# Patient Record
Sex: Female | Born: 1945 | Race: White | Hispanic: No | Marital: Married | Smoking: Former smoker
Health system: Southern US, Community
[De-identification: ages and names within clinical notes are randomized; demographics above are authoritative.]

## PROBLEM LIST (undated history)

## (undated) DIAGNOSIS — N390 Urinary tract infection, site not specified: Secondary | ICD-10-CM

## (undated) DIAGNOSIS — IMO0001 Reserved for inherently not codable concepts without codable children: Secondary | ICD-10-CM

## (undated) DIAGNOSIS — Q6689 Other  specified congenital deformities of feet: Secondary | ICD-10-CM

## (undated) DIAGNOSIS — F329 Major depressive disorder, single episode, unspecified: Secondary | ICD-10-CM

## (undated) DIAGNOSIS — S42301A Unspecified fracture of shaft of humerus, right arm, initial encounter for closed fracture: Secondary | ICD-10-CM

## (undated) DIAGNOSIS — K269 Duodenal ulcer, unspecified as acute or chronic, without hemorrhage or perforation: Secondary | ICD-10-CM

## (undated) DIAGNOSIS — F32A Depression, unspecified: Secondary | ICD-10-CM

## (undated) DIAGNOSIS — K219 Gastro-esophageal reflux disease without esophagitis: Secondary | ICD-10-CM

## (undated) DIAGNOSIS — F419 Anxiety disorder, unspecified: Secondary | ICD-10-CM

## (undated) DIAGNOSIS — D649 Anemia, unspecified: Secondary | ICD-10-CM

## (undated) DIAGNOSIS — R51 Headache: Secondary | ICD-10-CM

## (undated) DIAGNOSIS — Z87442 Personal history of urinary calculi: Secondary | ICD-10-CM

## (undated) DIAGNOSIS — Z8719 Personal history of other diseases of the digestive system: Secondary | ICD-10-CM

## (undated) DIAGNOSIS — M1990 Unspecified osteoarthritis, unspecified site: Secondary | ICD-10-CM

## (undated) DIAGNOSIS — R519 Headache, unspecified: Secondary | ICD-10-CM

## (undated) DIAGNOSIS — Z8489 Family history of other specified conditions: Secondary | ICD-10-CM

## (undated) HISTORY — PX: JOINT REPLACEMENT: SHX530

## (undated) HISTORY — PX: ABDOMINAL HYSTERECTOMY: SHX81

---

## 1978-06-05 HISTORY — PX: ABDOMINAL HYSTERECTOMY: SHX81

## 2014-01-27 DIAGNOSIS — M25551 Pain in right hip: Secondary | ICD-10-CM | POA: Insufficient documentation

## 2014-01-27 DIAGNOSIS — M5416 Radiculopathy, lumbar region: Secondary | ICD-10-CM | POA: Insufficient documentation

## 2014-04-17 NOTE — Progress Notes (Signed)
Please put orders in Epic surgery 05-04-14 pre op 04-27-14 Thanks

## 2014-04-21 ENCOUNTER — Ambulatory Visit: Payer: Self-pay | Admitting: Orthopedic Surgery

## 2014-04-27 ENCOUNTER — Encounter (HOSPITAL_COMMUNITY)
Admission: RE | Admit: 2014-04-27 | Discharge: 2014-04-27 | Disposition: A | Discharge status: Home / Self Care | Payer: Medicare Other | Source: Ambulatory Visit | Attending: Orthopedic Surgery | Admitting: Orthopedic Surgery

## 2014-04-27 ENCOUNTER — Ambulatory Visit (HOSPITAL_COMMUNITY)
Admission: RE | Admit: 2014-04-27 | Discharge: 2014-04-27 | Disposition: A | Discharge status: Home / Self Care | Payer: Medicare Other | Source: Ambulatory Visit | Attending: Orthopedic Surgery | Admitting: Orthopedic Surgery

## 2014-04-27 ENCOUNTER — Encounter (HOSPITAL_COMMUNITY): Payer: Self-pay

## 2014-04-27 DIAGNOSIS — Z01818 Encounter for other preprocedural examination: Secondary | ICD-10-CM | POA: Diagnosis present

## 2014-04-27 DIAGNOSIS — M1611 Unilateral primary osteoarthritis, right hip: Secondary | ICD-10-CM | POA: Diagnosis not present

## 2014-04-27 HISTORY — DX: Reserved for inherently not codable concepts without codable children: IMO0001

## 2014-04-27 HISTORY — DX: Unspecified osteoarthritis, unspecified site: M19.90

## 2014-04-27 HISTORY — DX: Anxiety disorder, unspecified: F41.9

## 2014-04-27 HISTORY — DX: Major depressive disorder, single episode, unspecified: F32.9

## 2014-04-27 HISTORY — DX: Family history of other specified conditions: Z84.89

## 2014-04-27 HISTORY — DX: Headache, unspecified: R51.9

## 2014-04-27 HISTORY — DX: Depression, unspecified: F32.A

## 2014-04-27 HISTORY — DX: Headache: R51

## 2014-04-27 HISTORY — DX: Other specified congenital deformities of feet: Q66.89

## 2014-04-27 HISTORY — DX: Unspecified fracture of shaft of humerus, right arm, initial encounter for closed fracture: S42.301A

## 2014-04-27 LAB — COMPREHENSIVE METABOLIC PANEL
ALBUMIN: 3.9 g/dL (ref 3.5–5.2)
ALK PHOS: 67 U/L (ref 39–117)
ALT: 19 U/L (ref 0–35)
ANION GAP: 11 (ref 5–15)
AST: 21 U/L (ref 0–37)
BUN: 17 mg/dL (ref 6–23)
CO2: 27 mEq/L (ref 19–32)
CREATININE: 0.74 mg/dL (ref 0.50–1.10)
Calcium: 9.7 mg/dL (ref 8.4–10.5)
Chloride: 101 mEq/L (ref 96–112)
GFR calc non Af Amer: 86 mL/min — ABNORMAL LOW (ref >90)
Glucose, Bld: 92 mg/dL (ref 70–99)
POTASSIUM: 4.9 meq/L (ref 3.7–5.3)
Sodium: 139 mEq/L (ref 137–147)
TOTAL PROTEIN: 7.2 g/dL (ref 6.0–8.3)
Total Bilirubin: 0.4 mg/dL (ref 0.3–1.2)

## 2014-04-27 LAB — CBC
HCT: 41.9 % (ref 36.0–46.0)
HEMOGLOBIN: 13.7 g/dL (ref 12.0–15.0)
MCH: 34 pg (ref 26.0–34.0)
MCHC: 32.7 g/dL (ref 30.0–36.0)
MCV: 104 fL — AB (ref 78.0–100.0)
Platelets: 274 10*3/uL (ref 150–400)
RBC: 4.03 MIL/uL (ref 3.87–5.11)
RDW: 13 % (ref 11.5–15.5)
WBC: 4.7 10*3/uL (ref 4.0–10.5)

## 2014-04-27 LAB — PROTIME-INR
INR: 1 (ref 0.00–1.49)
PROTHROMBIN TIME: 13.3 s (ref 11.6–15.2)

## 2014-04-27 LAB — URINALYSIS, ROUTINE W REFLEX MICROSCOPIC
Bilirubin Urine: NEGATIVE
Glucose, UA: NEGATIVE mg/dL
HGB URINE DIPSTICK: NEGATIVE
Ketones, ur: NEGATIVE mg/dL
LEUKOCYTES UA: NEGATIVE
Nitrite: NEGATIVE
PROTEIN: NEGATIVE mg/dL
Specific Gravity, Urine: 1.021 (ref 1.005–1.030)
UROBILINOGEN UA: 0.2 mg/dL (ref 0.0–1.0)
pH: 5.5 (ref 5.0–8.0)

## 2014-04-27 LAB — ABO/RH: ABO/RH(D): A POS

## 2014-04-27 LAB — SURGICAL PCR SCREEN
MRSA, PCR: NEGATIVE
STAPHYLOCOCCUS AUREUS: NEGATIVE

## 2014-04-27 LAB — APTT: APTT: 31 s (ref 24–37)

## 2014-04-27 NOTE — Progress Notes (Signed)
Clearance note per chart 03/16/2014 per Dr Hall Busing EKG per chart 03/16/2014

## 2014-04-27 NOTE — Patient Instructions (Signed)
YOUR SURGERY IS SCHEDULED AT [facility]  WF:[id number], May 04, 2014  REPORT TO  SHORT STAY CENTER AT:10:50 AM    PLEASE COME IN THE [facility] ENTRANCE AND FOLLOW SIGNS TO SHORT STAY CENTER.  DO NOT EAT  ANYTHING AFTER MIDNIGHT BUT YOU MAY TAKE CLEARS LIQUIDS UNTIL 7:30AM DAY OF SURGERY THEN NOTHING BY MOUTH.  PLEASE TAKE THE FOLLOWING MEDICATIONS THE AM OF YOUR SURGERY WITH A FEW SIPS OF WATER:Escitalopram;Hydrocodone-Acetaminophen if needed for pain     DO NOT BRING VALUABLES, MONEY, CREDIT CARDS.  DO NOT WEAR JEWELRY, MAKE-UP, NAIL POLISH AND NO METAL PINS OR CLIPS IN YOUR HAIR. CONTACT LENS, DENTURES / PARTIALS, GLASSES SHOULD NOT BE WORN TO SURGERY AND IN MOST CASES-HEARING AIDS WILL NEED TO BE REMOVED.  BRING YOUR GLASSES CASE, ANY EQUIPMENT NEEDED FOR YOUR CONTACT LENS. FOR PATIENTS ADMITTED TO THE HOSPITAL--CHECK OUT TIME THE DAY OF DISCHARGE IS 11:00 AM.  ALL INPATIENT ROOMS ARE PRIVATE - WITH BATHROOM, TELEPHONE, TELEVISION AND WIFI INTERNET.                                                    PLEASE READ OVER ANY  FACT SHEETS THAT YOU WERE GIVEN: MRSA INFORMATION, BLOOD TRANSFUSION INFORMATION, INCENTIVE SPIROMETER INFORMATION.  PLEASE BE AWARE THAT YOU MAY NEED ADDITIONAL BLOOD DRAWN DAY OF YOUR SURGERY  _______________________________________________________________________   [facility] - Preparing for Surgery Before surgery, you can play an important role.  Because skin is not sterile, your skin needs to be as free of germs as possible.  You can reduce the number of germs on your skin by washing with CHG (chlorahexidine gluconate) soap before surgery.  CHG is an antiseptic cleaner which kills germs and bonds with the skin to continue killing germs even after washing. Please DO NOT use if you have an allergy to CHG or antibacterial soaps.  If your skin becomes reddened/irritated stop using the CHG and inform your nurse when you arrive at Short  Stay. Do not shave (including legs and underarms) for at least 48 hours prior to the first CHG shower.  You may shave your face/neck. Please follow these instructions carefully:  1.  Shower with CHG Soap the night before surgery and the  morning of Surgery.  2.  If you choose to wash your hair, wash your hair first as usual with your  normal  shampoo.  3.  After you shampoo, rinse your hair and body thoroughly to remove the  shampoo.                           4.  Use CHG as you would any other liquid soap.  You can apply chg directly  to the skin and wash                       Gently with a scrungie or clean washcloth.  5.  Apply the CHG Soap to your body ONLY FROM THE NECK DOWN.   Do not use on face/ open                           Wound or open sores. Avoid contact with eyes, ears mouth and genitals (private parts).  Wash face,  Genitals (private parts) with your normal soap.             6.  Wash thoroughly, paying special attention to the area where your surgery  will be performed.  7.  Thoroughly rinse your body with warm water from the neck down.  8.  DO NOT shower/wash with your normal soap after using and rinsing off  the CHG Soap.                9.  Pat yourself dry with a clean towel.            10.  Wear clean pajamas.            11.  Place clean sheets on your bed the night of your first shower and do not  sleep with pets. Day of Surgery : Do not apply any lotions/deodorants the morning of surgery.  Please wear clean clothes to the hospital/surgery center.  FAILURE TO FOLLOW THESE INSTRUCTIONS MAY RESULT IN THE CANCELLATION OF YOUR SURGERY PATIENT SIGNATURE_________________________________  NURSE SIGNATURE__________________________________  ________________________________________________________________________    CLEAR LIQUID DIET   Foods Allowed                                                                     Foods Excluded  Coffee and tea,  regular and decaf                             liquids that you cannot  Plain Jell-O in any flavor                                             see through such as: Fruit ices (not with fruit pulp)                                     milk, soups, orange juice  Iced Popsicles                                    All solid food Carbonated beverages, regular and diet                                    Cranberry, grape and apple juices Sports drinks like Gatorade Lightly seasoned clear broth or consume(fat free) Sugar, honey syrup  Sample Menu Breakfast                                Lunch                                     Supper Cranberry juice  Beef broth                            Chicken broth Jell-O                                     Grape juice                           Apple juice Coffee or tea                        Jell-O                                      Popsicle                                                Coffee or tea                        Coffee or tea  _____________________________________________________________________    Incentive Spirometer  An incentive spirometer is a tool that can help keep your lungs clear and active. This tool measures how well you are filling your lungs with each breath. Taking long deep breaths may help reverse or decrease the chance of developing breathing (pulmonary) problems (especially infection) following:  A long period of time when you are unable to move or be active. BEFORE THE PROCEDURE   If the spirometer includes an indicator to show your best effort, your nurse or respiratory therapist will set it to a desired goal.  If possible, sit up straight or lean slightly forward. Try not to slouch.  Hold the incentive spirometer in an upright position. INSTRUCTIONS FOR USE   Sit on the edge of your bed if possible, or sit up as far as you can in bed or on a chair.  Hold the incentive spirometer in an upright  position.  Breathe out normally.  Place the mouthpiece in your mouth and seal your lips tightly around it.  Breathe in slowly and as deeply as possible, raising the piston or the ball toward the top of the column.  Hold your breath for 3-5 seconds or for as long as possible. Allow the piston or ball to fall to the bottom of the column.  Remove the mouthpiece from your mouth and breathe out normally.  Rest for a few seconds and repeat Steps 1 through 7 at least 10 times every 1-2 hours when you are awake. Take your time and take a few normal breaths between deep breaths.  The spirometer may include an indicator to show your best effort. Use the indicator as a goal to work toward during each repetition.  After each set of 10 deep breaths, practice coughing to be sure your lungs are clear. If you have an incision (the cut made at the time of surgery), support your incision when coughing by placing a pillow or rolled up towels firmly against it. Once you are able to get out of bed, walk around indoors and cough well. You may stop using the incentive  spirometer when instructed by your caregiver.  RISKS AND COMPLICATIONS  Take your time so you do not get dizzy or light-headed.  If you are in pain, you may need to take or ask for pain medication before doing incentive spirometry. It is harder to take a deep breath if you are having pain. AFTER USE  Rest and breathe slowly and easily.  It can be helpful to keep track of a log of your progress. Your caregiver can provide you with a simple table to help with this. If you are using the spirometer at home, follow these instructions: [place] IF:   You are having difficultly using the spirometer.  You have trouble using the spirometer as often as instructed.  Your pain medication is not giving enough relief while using the spirometer.  You develop fever of 100.5 F (38.1 C) or higher. SEEK IMMEDIATE MEDICAL CARE IF:   You cough  up bloody sputum that had not been present before.  You develop fever of 102 F (38.9 C) or greater.  You develop worsening pain at or near the incision site. MAKE SURE YOU:   Understand these instructions.  Will watch your condition.  Will get help right away if you are not doing well or get worse. Document Released: 10/02/2006 Document Revised: 08/14/2011 Document Reviewed: 12/03/2006 ExitCare Patient Information 2014 ExitCare, [place].   ________________________________________________________________________  WHAT IS A BLOOD TRANSFUSION? Blood Transfusion Information  A transfusion is the replacement of blood or some of its parts. Blood is made up of multiple cells which provide different functions.  Red blood cells carry oxygen and are used for blood loss replacement.  White blood cells fight against infection.  Platelets control bleeding.  Plasma helps clot blood.  Other blood products are available for specialized needs, such as hemophilia or other clotting disorders. BEFORE THE TRANSFUSION  Who gives blood for transfusions?   Healthy volunteers who are fully evaluated to make sure their blood is safe. This is blood bank blood. Transfusion therapy is the safest it has ever been in the practice of medicine. Before blood is taken from a donor, a complete history is taken to make sure that person has no history of diseases nor engages in risky social behavior (examples are intravenous drug use or sexual activity with multiple partners). The donor's travel history is screened to minimize risk of transmitting infections, such as malaria. The donated blood is tested for signs of infectious diseases, such as HIV and hepatitis. The blood is then tested to be sure it is compatible with you in order to minimize the chance of a transfusion reaction. If you or a relative donates blood, this is often done in anticipation of surgery and is not appropriate for emergency situations. It takes  many days to process the donated blood. RISKS AND COMPLICATIONS Although transfusion therapy is very safe and saves many lives, the main dangers of transfusion include:   Getting an infectious disease.  Developing a transfusion reaction. This is an allergic reaction to something in the blood you were given. Every precaution is taken to prevent this. The decision to have a blood transfusion has been considered carefully by your caregiver before blood is given. Blood is not given unless the benefits outweigh the risks. AFTER THE TRANSFUSION  Right after receiving a blood transfusion, you will usually feel much better and more energetic. This is especially true if your red blood cells have gotten low (anemic). The transfusion raises the level of the red  blood cells which carry oxygen, and this usually causes an energy increase.  The nurse administering the transfusion will monitor you carefully for complications. HOME CARE INSTRUCTIONS  No special instructions are needed after a transfusion. You may find your energy is better. Speak with your caregiver about any limitations on activity for underlying diseases you may have. SEEK MEDICAL CARE IF:   Your condition is not improving after your transfusion.  You develop redness or irritation at the intravenous (IV) site. SEEK IMMEDIATE MEDICAL CARE IF:  Any of the following symptoms occur over the next 12 hours:  Shaking chills.  You have a temperature by mouth above 102 F (38.9 C), not controlled by medicine.  Chest, back, or muscle pain.  People around you feel you are not acting correctly or are confused.  Shortness of breath or difficulty breathing.  Dizziness and fainting.  You get a rash or develop hives.  You have a decrease in urine output.  Your urine turns a dark color or changes to pink, red, or brown. Any of the following symptoms occur over the next 10 days:  You have a temperature by mouth above 102 F (38.9 C), not  controlled by medicine.  Shortness of breath.  Weakness after normal activity.  The white part of the eye turns yellow (jaundice).  You have a decrease in the amount of urine or are urinating less often.  Your urine turns a dark color or changes to pink, red, or brown. Document Released: 05/19/2000 Document Revised: 08/14/2011 Document Reviewed: 01/06/2008 [facility] Patient Information 2014 [place], [place].  _______________________________________________________________________

## 2014-05-03 ENCOUNTER — Ambulatory Visit: Payer: Self-pay | Admitting: Orthopedic Surgery

## 2014-05-03 NOTE — H&P (Signed)
Tiffany Mcclain DOB: November 11, 1945 Married / Language: English / Race: White Female Date of Admission:  05-04-2014 CC:  Right Hip Paqin History of Present Illness (Tiffany Mcclain L. Tiffany Mcclain III PA-C; 04/16/2014 3:06 PM) The patient is a 68 year old female who comes in today for a preoperative History and Physical. The patient is scheduled for a right total hip arthroplasty (anterior approach) to be performed by Dr. Dione Plover. Aluisio, MD at [facility] on 05-04-2014. The patient is a 68 year old female who presents for follow up of their hip. The patient is being followed for their right hip pain and osteoarthritis. They are several month(s) out from evaluation by Molli Barrows PA-C. Symptoms reported today include: pain, aching and difficulty ambulating. The patient feels that they are doing poorly and report their pain level to be moderate to severe. Current treatment includes: activity modification and NSAIDs (meloxicam). The following medication has been used for pain control: Hydrocodone (1/2 tab 2-3 times qd) and Tylenol. Risks and benefits of the surgery have been discussed with the patient and she elects to proceed with surgery. That left hip is getting progressively worse. It is hurting her at all times day and night. It is keeping her up at night. It is limiting what she can and cannot do. She is ready to proceed with the surgery. Risks and benefits of the surgery have been discussed with the patient and they elect to proceed with surgery.  There are on active contraindications to upcoming procedure such as ongoing infection or progressive neurological disease.  Problem List/Past Medical (Kamylah Manzo Monika Salk, III PA-C; 04/16/2014 2:54 PM) Primary osteoarthritis of right hip (M16.11) Migraine Headache Anxiety Disorder Depression Right Humerus Fracture  Allergies No Know Drug Allergies  Intolerance Codeine Derivatives -  Patient is able to take Hydrocodone  Family History  ([place], III PA-C; 04/16/2014 2:53 PM) Depression Maternal Grandfather. Hypertension Maternal Grandmother. Heart Disease Maternal Grandfather, Maternal Grandmother.  Social History (Jaxston Chohan Monika Salk, III PA-C; 04/16/2014 2:53 PM) Marital status married Current drinker 02/25/2014: Currently drinks wine 5-7 times per week Tobacco use Former smoker. 02/25/2014: smoke(d) less than 1/2 pack(s) per day uses less than 1/2 can(s) smokeless per week Tobacco / smoke exposure 02/25/2014: no Under pain contract Exercise Exercises rarely; does other Living situation live with spouse Children 2 Current work status retired Number of flights of stairs before winded 2-3 No history of drug/alcohol rehab  Medication History (Dak Szumski L Tayley Mudrick, III PA-C; 04/16/2014 3:18 PM) Norco (5-325MG  Tablet, 1-2 Oral po bid prn pain, Taken starting 04/07/2014) Active. Tylenol Extra Strength (500MG  Tablet, Oral) Active. Meloxicam (15MG  Tablet, Oral) Active. Escitalopram Oxalate (20MG  Tablet, Oral) Active.   Past Surgical History (Gerard Cantara Monika Salk, III PA-C; 04/16/2014 2:53 PM) Hysterectomy Date: 80.  Review of Systems (Dezarai Prew L. Jacelynn Hayton III PA-C; 04/16/2014 3:12 PM) General Not Present- Chills, Fatigue, Fever, Memory Loss, Night Sweats, Weight Gain and Weight Loss. Skin Not Present- Eczema, Hives, Itching, Lesions and Rash. HEENT Not Present- Dentures, Double Vision, Headache, Hearing Loss, Tinnitus and Visual Loss. Respiratory Not Present- Allergies, Chronic Cough, Coughing up blood, Shortness of breath at rest and Shortness of breath with exertion. Cardiovascular Not Present- Chest Pain, Difficulty Breathing Lying Down, Murmur, Palpitations, Racing/skipping heartbeats and Swelling. Gastrointestinal Not Present- Abdominal Pain, Bloody Stool, Constipation, Diarrhea, Difficulty Swallowing, Heartburn, Jaundice, Loss of appetitie, Nausea and Vomiting. Female  Genitourinary Not Present- Blood in Urine, Discharge, Flank Pain, Incontinence, Painful Urination, Urgency, Urinary frequency, Urinary Retention, Urinating at Night and  Weak urinary stream. Musculoskeletal Present- Joint Pain. Not Present- Back Pain, Joint Swelling, Morning Stiffness, Muscle Pain, Muscle Weakness and Spasms. Neurological Not Present- Blackout spells, Difficulty with balance, Dizziness, Paralysis, Tremor and Weakness. Psychiatric Not Present- Insomnia.  Vitals  Weight: 163 lb Height: 63.5in Weight was reported by patient. Height was reported by patient. Body Surface Area: 1.78 m Body Mass Index: 28.42 kg/m  BP: 132/78 (Sitting, Right Arm, Standard)  Physical Exam (Elinore Shults L. Shihab States III PA-C; 04/16/2014 3:25 PM) General Mental Status -Alert, cooperative and good historian. General Appearance-pleasant, Not in acute distress. Orientation-Oriented X3. Build & Nutrition-Well nourished and Well developed.  Head and Neck Head-normocephalic, atraumatic . Neck Global Assessment - supple, no bruit auscultated on the right, no bruit auscultated on the left.  Eye Vision-Wears contact lenses. Pupil - Bilateral-Regular and Round. Motion - Bilateral-EOMI.  Chest and Lung Exam Auscultation Breath sounds - clear at anterior chest wall and clear at posterior chest wall. Adventitious sounds - No Adventitious sounds.  Cardiovascular Auscultation Rhythm - Regular rate and rhythm. Heart Sounds - S1 WNL and S2 WNL. Murmurs & Other Heart Sounds - Auscultation of the heart reveals - No Murmurs.  Abdomen Palpation/Percussion Tenderness - Abdomen is non-tender to palpation. Rigidity (guarding) - Abdomen is soft. Auscultation Auscultation of the abdomen reveals - Bowel sounds normal.  Female Genitourinary Note: Not done, not pertinent to present illness   Musculoskeletal Note: On examination, she is alert and oriented in no apparent distress.  Evaluation of her right hip shows flexion at 90 degrees. No internal or external rotation. Abduction is about 20. Her left hip has normal motion with no discomfort. Gait pattern is antalgic on the right and she is utilizing a cane.  RADIOGRAPHS: X-rays AP pelvis and lateral of the right hip shows severe bone on bone arthritis with some lateral subluxation of the femoral head and slight flattening of the head. There is subchondral cystic formation.   Assessment & Plan (Ermel Verne L. Goldy Calandra III PA-C; 04/16/2014 3:11 PM) Primary osteoarthritis of right hip (M16.11) Note:Plan is for a Right Total Hip Replacement - Anterior Approach  by Dr. Wynelle Link.  Plan is to go home.  PCP - Dr. Hall Busing - Patient has been seen preoperatively and felt to be stable for surgery.  The patient does not have any contraindications and will receive TXA (tranexamic acid) prior to surgery.  Signed electronically by Joelene Millin, III PA-C

## 2014-05-04 ENCOUNTER — Inpatient Hospital Stay (HOSPITAL_COMMUNITY): Payer: Medicare Other

## 2014-05-04 ENCOUNTER — Encounter (HOSPITAL_COMMUNITY): Payer: Self-pay | Admitting: *Deleted

## 2014-05-04 ENCOUNTER — Inpatient Hospital Stay (HOSPITAL_COMMUNITY)
Admission: RE | Admit: 2014-05-04 | Discharge: 2014-05-06 | DRG: 470 | Disposition: A | Discharge status: Home / Self Care | Payer: Medicare Other | Source: Ambulatory Visit | Attending: Orthopedic Surgery | Admitting: Orthopedic Surgery

## 2014-05-04 ENCOUNTER — Inpatient Hospital Stay (HOSPITAL_COMMUNITY): Payer: Medicare Other | Admitting: Anesthesiology

## 2014-05-04 ENCOUNTER — Encounter (HOSPITAL_COMMUNITY)
Admission: RE | Disposition: A | Discharge status: Home / Self Care | Payer: Self-pay | Source: Ambulatory Visit | Attending: Orthopedic Surgery

## 2014-05-04 DIAGNOSIS — F419 Anxiety disorder, unspecified: Secondary | ICD-10-CM | POA: Diagnosis present

## 2014-05-04 DIAGNOSIS — M25551 Pain in right hip: Secondary | ICD-10-CM | POA: Diagnosis present

## 2014-05-04 DIAGNOSIS — F329 Major depressive disorder, single episode, unspecified: Secondary | ICD-10-CM | POA: Diagnosis present

## 2014-05-04 DIAGNOSIS — M169 Osteoarthritis of hip, unspecified: Secondary | ICD-10-CM | POA: Diagnosis present

## 2014-05-04 DIAGNOSIS — G43909 Migraine, unspecified, not intractable, without status migrainosus: Secondary | ICD-10-CM | POA: Diagnosis present

## 2014-05-04 DIAGNOSIS — Z96649 Presence of unspecified artificial hip joint: Secondary | ICD-10-CM

## 2014-05-04 DIAGNOSIS — M1611 Unilateral primary osteoarthritis, right hip: Principal | ICD-10-CM | POA: Diagnosis present

## 2014-05-04 HISTORY — PX: TOTAL HIP ARTHROPLASTY: SHX124

## 2014-05-04 LAB — TYPE AND SCREEN
ABO/RH(D): A POS
Antibody Screen: NEGATIVE

## 2014-05-04 SURGERY — ARTHROPLASTY, HIP, TOTAL, ANTERIOR APPROACH
Anesthesia: Spinal | Site: Hip | Laterality: Right

## 2014-05-04 MED ORDER — PROPOFOL INFUSION 10 MG/ML OPTIME
INTRAVENOUS | Status: DC | PRN
  Administered 2014-05-04: 75 ug/kg/min via INTRAVENOUS

## 2014-05-04 MED ORDER — EPHEDRINE SULFATE 50 MG/ML IJ SOLN
INTRAMUSCULAR | Status: DC | PRN
  Administered 2014-05-04 (×2): 5 mg via INTRAVENOUS

## 2014-05-04 MED ORDER — SODIUM CHLORIDE 0.9 % IJ SOLN
INTRAMUSCULAR | Status: AC
  Filled 2014-05-04: qty 50

## 2014-05-04 MED ORDER — 0.9 % SODIUM CHLORIDE (POUR BTL) OPTIME
TOPICAL | Status: DC | PRN
  Administered 2014-05-04: 1000 mL

## 2014-05-04 MED ORDER — BUPIVACAINE HCL (PF) 0.25 % IJ SOLN
INTRAMUSCULAR | Status: AC
  Filled 2014-05-04: qty 30

## 2014-05-04 MED ORDER — DOCUSATE SODIUM 100 MG PO CAPS
100.0000 mg | ORAL_CAPSULE | Freq: Two times a day (BID) | ORAL | Status: DC
  Administered 2014-05-04 – 2014-05-06 (×4): 100 mg via ORAL

## 2014-05-04 MED ORDER — DEXAMETHASONE SODIUM PHOSPHATE 10 MG/ML IJ SOLN
10.0000 mg | Freq: Once | INTRAMUSCULAR | Status: AC
  Administered 2014-05-04: 10 mg via INTRAVENOUS

## 2014-05-04 MED ORDER — LIDOCAINE HCL (CARDIAC) 20 MG/ML IV SOLN
INTRAVENOUS | Status: DC | PRN
  Administered 2014-05-04: 10 mg via INTRAVENOUS

## 2014-05-04 MED ORDER — FLEET ENEMA 7-19 GM/118ML RE ENEM: 1.0000 | ENEMA | Freq: Once | RECTAL | Status: AC | PRN

## 2014-05-04 MED ORDER — ACETAMINOPHEN 650 MG RE SUPP: 650.0000 mg | Freq: Four times a day (QID) | RECTAL | Status: DC | PRN

## 2014-05-04 MED ORDER — DIPHENHYDRAMINE HCL 12.5 MG/5ML PO ELIX: 12.5000 mg | ORAL_SOLUTION | ORAL | Status: DC | PRN

## 2014-05-04 MED ORDER — ONDANSETRON HCL 4 MG/2ML IJ SOLN
INTRAMUSCULAR | Status: AC
  Filled 2014-05-04: qty 2

## 2014-05-04 MED ORDER — DEXAMETHASONE SODIUM PHOSPHATE 10 MG/ML IJ SOLN
10.0000 mg | Freq: Once | INTRAMUSCULAR | Status: AC
  Administered 2014-05-05: 10 mg via INTRAVENOUS
  Filled 2014-05-04: qty 1

## 2014-05-04 MED ORDER — LACTATED RINGERS IV SOLN: INTRAVENOUS | Status: DC

## 2014-05-04 MED ORDER — SODIUM CHLORIDE 0.9 % IJ SOLN
INTRAMUSCULAR | Status: AC
  Filled 2014-05-04: qty 10

## 2014-05-04 MED ORDER — BUPIVACAINE LIPOSOME 1.3 % IJ SUSP
20.0000 mL | Freq: Once | INTRAMUSCULAR | Status: DC
  Filled 2014-05-04: qty 20

## 2014-05-04 MED ORDER — ACETAMINOPHEN 325 MG PO TABS: 650.0000 mg | ORAL_TABLET | Freq: Four times a day (QID) | ORAL | Status: DC | PRN

## 2014-05-04 MED ORDER — PHENYLEPHRINE HCL 10 MG/ML IJ SOLN
INTRAMUSCULAR | Status: DC | PRN
  Administered 2014-05-04: 40 ug via INTRAVENOUS
  Administered 2014-05-04: 80 ug via INTRAVENOUS
  Administered 2014-05-04: 40 ug via INTRAVENOUS

## 2014-05-04 MED ORDER — PROPOFOL 10 MG/ML IV BOLUS
INTRAVENOUS | Status: AC
  Filled 2014-05-04: qty 20

## 2014-05-04 MED ORDER — METOCLOPRAMIDE HCL 10 MG PO TABS: 5.0000 mg | ORAL_TABLET | Freq: Three times a day (TID) | ORAL | Status: DC | PRN

## 2014-05-04 MED ORDER — CEFAZOLIN SODIUM-DEXTROSE 2-3 GM-% IV SOLR
2.0000 g | INTRAVENOUS | Status: AC
  Administered 2014-05-04: 2 g via INTRAVENOUS

## 2014-05-04 MED ORDER — ACETAMINOPHEN 10 MG/ML IV SOLN
1000.0000 mg | Freq: Once | INTRAVENOUS | Status: AC
  Administered 2014-05-04: 1000 mg via INTRAVENOUS
  Filled 2014-05-04: qty 100

## 2014-05-04 MED ORDER — EPHEDRINE SULFATE 50 MG/ML IJ SOLN
INTRAMUSCULAR | Status: AC
  Filled 2014-05-04: qty 1

## 2014-05-04 MED ORDER — POLYETHYLENE GLYCOL 3350 17 G PO PACK
17.0000 g | PACK | Freq: Every day | ORAL | Status: DC | PRN
  Administered 2014-05-06: 17 g via ORAL
  Filled 2014-05-04: qty 1

## 2014-05-04 MED ORDER — BISACODYL 10 MG RE SUPP: 10.0000 mg | Freq: Every day | RECTAL | Status: DC | PRN

## 2014-05-04 MED ORDER — FENTANYL CITRATE 0.05 MG/ML IJ SOLN
INTRAMUSCULAR | Status: AC
  Filled 2014-05-04: qty 2

## 2014-05-04 MED ORDER — PHENYLEPHRINE HCL 10 MG/ML IJ SOLN
10.0000 mg | INTRAMUSCULAR | Status: DC | PRN
Start: 2014-05-04 — End: 2014-05-04
  Administered 2014-05-04: 50 ug/min via INTRAVENOUS

## 2014-05-04 MED ORDER — BUPIVACAINE IN DEXTROSE 0.75-8.25 % IT SOLN
INTRATHECAL | Status: DC | PRN
  Administered 2014-05-04: 2 mL via INTRATHECAL

## 2014-05-04 MED ORDER — MENTHOL 3 MG MT LOZG
1.0000 | LOZENGE | OROMUCOSAL | Status: DC | PRN
  Filled 2014-05-04: qty 9

## 2014-05-04 MED ORDER — MIDAZOLAM HCL 5 MG/5ML IJ SOLN
INTRAMUSCULAR | Status: DC | PRN
  Administered 2014-05-04: 2 mg via INTRAVENOUS

## 2014-05-04 MED ORDER — ALPRAZOLAM 0.25 MG PO TABS
0.2500 mg | ORAL_TABLET | Freq: Two times a day (BID) | ORAL | Status: DC | PRN
  Administered 2014-05-05: 0.25 mg via ORAL
  Filled 2014-05-04: qty 1

## 2014-05-04 MED ORDER — ONDANSETRON HCL 4 MG PO TABS: 4.0000 mg | ORAL_TABLET | Freq: Four times a day (QID) | ORAL | Status: DC | PRN

## 2014-05-04 MED ORDER — OXYCODONE HCL 5 MG PO TABS
5.0000 mg | ORAL_TABLET | ORAL | Status: DC | PRN
  Administered 2014-05-04 – 2014-05-06 (×13): 10 mg via ORAL
  Filled 2014-05-04 (×13): qty 2

## 2014-05-04 MED ORDER — DEXAMETHASONE SODIUM PHOSPHATE 10 MG/ML IJ SOLN
INTRAMUSCULAR | Status: AC
  Filled 2014-05-04: qty 1

## 2014-05-04 MED ORDER — HYDROMORPHONE HCL 1 MG/ML IJ SOLN: 0.2500 mg | INTRAMUSCULAR | Status: DC | PRN

## 2014-05-04 MED ORDER — ONDANSETRON HCL 4 MG/2ML IJ SOLN: 4.0000 mg | Freq: Four times a day (QID) | INTRAMUSCULAR | Status: DC | PRN

## 2014-05-04 MED ORDER — ONDANSETRON HCL 4 MG/2ML IJ SOLN
INTRAMUSCULAR | Status: DC | PRN
  Administered 2014-05-04: 4 mg via INTRAVENOUS

## 2014-05-04 MED ORDER — MIDAZOLAM HCL 2 MG/2ML IJ SOLN
INTRAMUSCULAR | Status: AC
  Filled 2014-05-04: qty 2

## 2014-05-04 MED ORDER — BUPIVACAINE HCL (PF) 0.25 % IJ SOLN
INTRAMUSCULAR | Status: DC | PRN
  Administered 2014-05-04: 20 mL

## 2014-05-04 MED ORDER — RIVAROXABAN 10 MG PO TABS
10.0000 mg | ORAL_TABLET | Freq: Every day | ORAL | Status: DC
  Administered 2014-05-05 – 2014-05-06 (×2): 10 mg via ORAL
  Filled 2014-05-04 (×3): qty 1

## 2014-05-04 MED ORDER — PHENYLEPHRINE 40 MCG/ML (10ML) SYRINGE FOR IV PUSH (FOR BLOOD PRESSURE SUPPORT)
PREFILLED_SYRINGE | INTRAVENOUS | Status: AC
  Filled 2014-05-04: qty 10

## 2014-05-04 MED ORDER — PHENOL 1.4 % MT LIQD
1.0000 | OROMUCOSAL | Status: DC | PRN
  Filled 2014-05-04: qty 177

## 2014-05-04 MED ORDER — SODIUM CHLORIDE 0.9 % IJ SOLN
INTRAMUSCULAR | Status: DC | PRN
  Administered 2014-05-04: 30 mL via INTRAVENOUS

## 2014-05-04 MED ORDER — BUPIVACAINE LIPOSOME 1.3 % IJ SUSP
INTRAMUSCULAR | Status: DC | PRN
  Administered 2014-05-04: 20 mL

## 2014-05-04 MED ORDER — CEFAZOLIN SODIUM-DEXTROSE 2-3 GM-% IV SOLR
2.0000 g | Freq: Four times a day (QID) | INTRAVENOUS | Status: AC
  Administered 2014-05-04 – 2014-05-05 (×2): 2 g via INTRAVENOUS
  Filled 2014-05-04 (×2): qty 50

## 2014-05-04 MED ORDER — ACETAMINOPHEN 500 MG PO TABS
1000.0000 mg | ORAL_TABLET | Freq: Four times a day (QID) | ORAL | Status: AC
Start: 2014-05-04 — End: 2014-05-05
  Administered 2014-05-04 – 2014-05-05 (×4): 1000 mg via ORAL
  Filled 2014-05-04 (×4): qty 2

## 2014-05-04 MED ORDER — LACTATED RINGERS IV SOLN
INTRAVENOUS | Status: DC
  Administered 2014-05-04: 1000 mL via INTRAVENOUS
  Administered 2014-05-04: 14:00:00 via INTRAVENOUS
  Administered 2014-05-04: 1000 mL via INTRAVENOUS

## 2014-05-04 MED ORDER — ESCITALOPRAM OXALATE 20 MG PO TABS
20.0000 mg | ORAL_TABLET | Freq: Every morning | ORAL | Status: DC
  Administered 2014-05-05 – 2014-05-06 (×2): 20 mg via ORAL
  Filled 2014-05-04 (×2): qty 1

## 2014-05-04 MED ORDER — MORPHINE SULFATE 2 MG/ML IJ SOLN
1.0000 mg | INTRAMUSCULAR | Status: DC | PRN
  Administered 2014-05-04 (×2): 1 mg via INTRAVENOUS
  Filled 2014-05-04 (×2): qty 1

## 2014-05-04 MED ORDER — METOCLOPRAMIDE HCL 5 MG/ML IJ SOLN: 5.0000 mg | Freq: Three times a day (TID) | INTRAMUSCULAR | Status: DC | PRN

## 2014-05-04 MED ORDER — FENTANYL CITRATE 0.05 MG/ML IJ SOLN
INTRAMUSCULAR | Status: DC | PRN
  Administered 2014-05-04 (×2): 50 ug via INTRAVENOUS

## 2014-05-04 MED ORDER — DEXTROSE-NACL 5-0.9 % IV SOLN
INTRAVENOUS | Status: DC
  Administered 2014-05-04: 20:00:00 via INTRAVENOUS

## 2014-05-04 MED ORDER — CEFAZOLIN SODIUM-DEXTROSE 2-3 GM-% IV SOLR
INTRAVENOUS | Status: AC
  Filled 2014-05-04: qty 50

## 2014-05-04 MED ORDER — DEXTROSE 5 % IV SOLN
500.0000 mg | Freq: Four times a day (QID) | INTRAVENOUS | Status: DC | PRN
  Administered 2014-05-04 – 2014-05-05 (×2): 500 mg via INTRAVENOUS
  Filled 2014-05-04 (×3): qty 5

## 2014-05-04 MED ORDER — TRANEXAMIC ACID 100 MG/ML IV SOLN
1000.0000 mg | INTRAVENOUS | Status: AC
  Administered 2014-05-04: 1000 mg via INTRAVENOUS
  Filled 2014-05-04: qty 10

## 2014-05-04 MED ORDER — METHOCARBAMOL 500 MG PO TABS
500.0000 mg | ORAL_TABLET | Freq: Four times a day (QID) | ORAL | Status: DC | PRN
  Administered 2014-05-04 – 2014-05-06 (×6): 500 mg via ORAL
  Filled 2014-05-04 (×6): qty 1

## 2014-05-04 SURGICAL SUPPLY — 38 items
BAG ZIPLOCK 12X15 (MISCELLANEOUS) IMPLANT
BLADE EXTENDED COATED 6.5IN (ELECTRODE) ×3 IMPLANT
BLADE SAG 18X100X1.27 (BLADE) ×3 IMPLANT
CAPT HIP PF COP ×3 IMPLANT
CLOSURE WOUND 1/2 X4 (GAUZE/BANDAGES/DRESSINGS) ×1
COVER PERINEAL POST (MISCELLANEOUS) ×3 IMPLANT
DECANTER SPIKE VIAL GLASS SM (MISCELLANEOUS) ×3 IMPLANT
DRAPE C-ARM 42X120 X-RAY (DRAPES) ×3 IMPLANT
DRAPE STERI IOBAN 125X83 (DRAPES) ×3 IMPLANT
DRAPE U-SHAPE 47X51 STRL (DRAPES) ×9 IMPLANT
DRSG ADAPTIC 3X8 NADH LF (GAUZE/BANDAGES/DRESSINGS) ×3 IMPLANT
DRSG MEPILEX BORDER 4X4 (GAUZE/BANDAGES/DRESSINGS) ×3 IMPLANT
DRSG MEPILEX BORDER 4X8 (GAUZE/BANDAGES/DRESSINGS) ×3 IMPLANT
DURAPREP 26ML APPLICATOR (WOUND CARE) ×3 IMPLANT
ELECT REM PT RETURN 9FT ADLT (ELECTROSURGICAL) ×3
ELECTRODE REM PT RTRN 9FT ADLT (ELECTROSURGICAL) ×1 IMPLANT
EVACUATOR 1/8 PVC DRAIN (DRAIN) ×3 IMPLANT
FACESHIELD WRAPAROUND (MASK) ×15 IMPLANT
GLOVE BIO SURGEON STRL SZ7.5 (GLOVE) ×3 IMPLANT
GLOVE BIO SURGEON STRL SZ8 (GLOVE) ×6 IMPLANT
GLOVE BIOGEL PI IND STRL 8 (GLOVE) ×2 IMPLANT
GLOVE BIOGEL PI INDICATOR 8 (GLOVE) ×4
GOWN STRL REUS W/TWL LRG LVL3 (GOWN DISPOSABLE) ×3 IMPLANT
GOWN STRL REUS W/TWL XL LVL3 (GOWN DISPOSABLE) ×3 IMPLANT
KIT BASIN OR (CUSTOM PROCEDURE TRAY) ×3 IMPLANT
NDL SAFETY ECLIPSE 18X1.5 (NEEDLE) ×2 IMPLANT
NEEDLE HYPO 18GX1.5 SHARP (NEEDLE) ×4
PACK TOTAL JOINT (CUSTOM PROCEDURE TRAY) ×3 IMPLANT
STRIP CLOSURE SKIN 1/2X4 (GAUZE/BANDAGES/DRESSINGS) ×2 IMPLANT
SUT ETHIBOND NAB CT1 #1 30IN (SUTURE) ×3 IMPLANT
SUT MNCRL AB 4-0 PS2 18 (SUTURE) ×3 IMPLANT
SUT VIC AB 2-0 CT1 27 (SUTURE) ×4
SUT VIC AB 2-0 CT1 TAPERPNT 27 (SUTURE) ×2 IMPLANT
SUT VLOC 180 0 24IN GS25 (SUTURE) ×6 IMPLANT
SYR 20CC LL (SYRINGE) ×3 IMPLANT
SYR 50ML LL SCALE MARK (SYRINGE) ×3 IMPLANT
TOWEL OR 17X26 10 PK STRL BLUE (TOWEL DISPOSABLE) ×3 IMPLANT
TRAY FOLEY CATH 14FRSI W/METER (CATHETERS) ×3 IMPLANT

## 2014-05-04 NOTE — Plan of Care (Signed)
Problem: Consults Goal: Total Joint Replacement Patient Education See Patient Education Module for education specifics. Outcome: Completed/Met Date Met:  05/04/14     

## 2014-05-04 NOTE — Anesthesia Preprocedure Evaluation (Signed)
Anesthesia Evaluation  Patient identified by MRN, date of birth, ID band Patient awake    Reviewed: Allergy & Precautions, H&P , NPO status , Patient's Chart, lab work & pertinent test results  Airway Mallampati: II  TM Distance: >3 FB Neck ROM: full    Dental  (+) Caps, Dental Advisory Given All front upper capped:   Pulmonary neg pulmonary ROS, shortness of breath and with exertion, former smoker,  breath sounds clear to auscultation  Pulmonary exam normal       Cardiovascular Exercise Tolerance: Good negative cardio ROS  Rhythm:regular Rate:Normal     Neuro/Psych negative neurological ROS  negative psych ROS   GI/Hepatic negative GI ROS, Neg liver ROS,   Endo/Other  negative endocrine ROS  Renal/GU negative Renal ROS  negative genitourinary   Musculoskeletal   Abdominal   Peds  Hematology negative hematology ROS (+)   Anesthesia Other Findings   Reproductive/Obstetrics negative OB ROS                             Anesthesia Physical Anesthesia Plan  ASA: II  Anesthesia Plan: Spinal   Post-op Pain Management:    Induction:   Airway Management Planned: Simple Face Mask  Additional Equipment:   Intra-op Plan:   Post-operative Plan:   Informed Consent: I have reviewed the patients History and Physical, chart, labs and discussed the procedure including the risks, benefits and alternatives for the proposed anesthesia with the patient or authorized representative who has indicated his/her understanding and acceptance.   Dental Advisory Given  Plan Discussed with: CRNA  Anesthesia Plan Comments:         Anesthesia Quick Evaluation

## 2014-05-04 NOTE — Anesthesia Procedure Notes (Signed)
Spinal Patient location during procedure: OR Start time: 05/04/2014 2:00 PM Staffing Resident/CRNA: Maxwell Caul Performed by: resident/CRNA  Preanesthetic Checklist Completed: patient identified, surgical consent, pre-op evaluation, timeout performed, IV checked, risks and benefits discussed and monitors and equipment checked Spinal Block Patient position: sitting Prep: Betadine Patient monitoring: heart rate, continuous pulse ox and blood pressure Approach: midline Location: L3-4 Injection technique: single-shot Needle Needle type: Whitacre  Needle gauge: 25 G Assessment Sensory level: T6 Additional Notes Anesthesia time out. Expiration date and lot checked on spinal tray.

## 2014-05-04 NOTE — Interval H&P Note (Signed)
History and Physical Interval Note:  05/04/2014 1:48 PM  Tiffany Mcclain  has presented today for surgery, with the diagnosis of OA RIGHT HIP  The various methods of treatment have been discussed with the patient and family. After consideration of risks, benefits and other options for treatment, the patient has consented to  Procedure(s): RIGHT TOTAL HIP ARTHROPLASTY ANTERIOR APPROACH (Right) as a surgical intervention .  The patient's history has been reviewed, patient examined, no change in status, stable for surgery.  I have reviewed the patient's chart and labs.  Questions were answered to the patient's satisfaction.     Gearlean Alf

## 2014-05-04 NOTE — Op Note (Signed)
OPERATIVE REPORT  PREOPERATIVE DIAGNOSIS: Osteoarthritis of the Right hip.   POSTOPERATIVE DIAGNOSIS: Osteoarthritis of the Right  hip.   PROCEDURE: Right total hip arthroplasty, anterior approach.   SURGEON: Gaynelle Arabian, MD   ASSISTANT: Arlee Muslim, PA-C  ANESTHESIA:  Spinal  ESTIMATED BLOOD LOSS:- 550 ml  DRAINS: Hemovac x1.   COMPLICATIONS: None   CONDITION: PACU - hemodynamically stable.   BRIEF CLINICAL NOTE: Tiffany Mcclain is a 68 y.o. female who has advanced end-  stage arthritis of her Right  hip with progressively worsening pain and  dysfunction.The patient has failed nonoperative management and presents for  total hip arthroplasty.   PROCEDURE IN DETAIL: After successful administration of spinal  anesthetic, the traction boots for the [facility] bed were placed on both  feet and the patient was placed onto the [facility] bed, boots placed into the leg  holders. The Right hip was then isolated from the perineum with plastic  drapes and prepped and draped in the usual sterile fashion. ASIS and  greater trochanter were marked and a oblique incision was made, starting  at about 1 cm lateral and 2 cm distal to the ASIS and coursing towards  the anterior cortex of the femur. The skin was cut with a 10 blade  through subcutaneous tissue to the level of the fascia overlying the  tensor fascia lata muscle. The fascia was then incised in line with the  incision at the junction of the anterior third and posterior 2/3rd. The  muscle was teased off the fascia and then the interval between the TFL  and the rectus was developed. The Hohmann retractor was then placed at  the top of the femoral neck over the capsule. The vessels overlying the  capsule were cauterized and the fat on top of the capsule was removed.  A Hohmann retractor was then placed anterior underneath the rectus  femoris to give exposure to the entire anterior capsule. A T-shaped  capsulotomy was  performed. The edges were tagged and the femoral head  was identified.       Osteophytes are removed off the superior acetabulum.  The femoral neck was then cut in situ with an oscillating saw. Traction  was then applied to the left lower extremity utilizing the [facility]  traction. The femoral head was then removed. Retractors were placed  around the acetabulum and then circumferential removal of the labrum was  performed. Osteophytes were also removed. Reaming starts at 43 mm to  medialize and  Increased in 2 mm increments to 49 mm. We reamed in  approximately 40 degrees of abduction, 20 degrees anteversion. A 50 mm  pinnacle acetabular shell was then impacted in anatomic position under  fluoroscopic guidance with excellent purchase. We did not need to place  any additional dome screws. A 32 mm neutral + 4 marathon liner was then  placed into the acetabular shell.       The femoral lift was then placed along the lateral aspect of the femur  just distal to the vastus ridge. The leg was  externally rotated and capsule  was stripped off the inferior aspect of the femoral neck down to the  level of the lesser trochanter, this was done with electrocautery. The femur was lifted after this was performed. The  leg was then placed and extended in adducted position to essentially delivering the femur. We also removed the capsule superiorly and the  piriformis from the piriformis fossa  to gain excellent exposure of the  proximal femur. Rongeur was used to remove some cancellous bone to get  into the lateral portion of the proximal femur for placement of the  initial starter reamer. The starter broaches was placed  the starter broach  and was shown to go down the center of the canal. Broaching  with the  Corail system was then performed starting at size 8, coursing  Up to size 11. A size 11 had excellent torsional and rotational  and axial stability. The trial high offset neck was then placed  with a 32 +  1 trial head. The hip was then reduced. We confirmed that  the stem was in the canal both on AP and lateral x-rays. It also has excellent sizing. The hip was reduced with outstanding stability through full extension, full external rotation,  and then flexion in adduction internal rotation. AP pelvis was taken  and the leg lengths were measured and found to be exactly equal. Hip  was then dislocated again and the femoral head and neck removed. The  femoral broach was removed. Size 11 Corail stem with a high offset  neck was then impacted into the femur following native anteversion. Has  excellent purchase in the canal. Excellent torsional and rotational and  axial stability. It is confirmed to be in the canal on AP and lateral  fluoroscopic views. The 32 + 1 ceramic head was placed and the hip  reduced with outstanding stability. Again AP pelvis was taken and it  confirmed that the leg lengths were equal. The wound was then copiously  irrigated with saline solution and the capsule reattached and repaired  with Ethibond suture.  20 mL of Exparel mixed with 50 mL of saline then additional 20 ml of .25% Bupivicaine injected into the capsule and into the edge of the tensor fascia lata as well as subcutaneous tissue. The fascia overlying the tensor fascia lata was  then closed with a running #1 V-Loc. Subcu was closed with interrupted  2-0 Vicryl and subcuticular running 4-0 Monocryl. Incision was cleaned  and dried. Steri-Strips and a bulky sterile dressing applied. Hemovac  drain was hooked to suction and then he was awakened and transported to  recovery in stable condition.        Please note that a surgical assistant was a medical necessity for this procedure to perform it in a safe and expeditious manner. Assistant was necessary to provide appropriate retraction of vital neurovascular structures and to prevent femoral fracture and allow for anatomic placement of the prosthesis.  Gaynelle Arabian,  M.D.

## 2014-05-04 NOTE — H&P (View-Only) (Signed)
Tiffany Mcclain DOB: [birth year] Married / Language: English / Race: White Female Date of Admission:  05-04-2014 CC:  Right Hip Paqin History of Present Illness (Dicky Boer L. Antario Yasuda III PA-C; 04/16/2014 3:06 PM) The patient is a 68 year old female who comes in today for a preoperative History and Physical. The patient is scheduled for a right total hip arthroplasty (anterior approach) to be performed by Dr. Dione Plover. Aluisio, MD at [facility] on 05-04-2014. The patient is a 68 year old female who presents for follow up of their hip. The patient is being followed for their right hip pain and osteoarthritis. They are several month(s) out from evaluation by Molli Barrows PA-C. Symptoms reported today include: pain, aching and difficulty ambulating. The patient feels that they are doing poorly and report their pain level to be moderate to severe. Current treatment includes: activity modification and NSAIDs (meloxicam). The following medication has been used for pain control: Hydrocodone (1/2 tab 2-3 times qd) and Tylenol. Risks and benefits of the surgery have been discussed with the patient and she elects to proceed with surgery. That left hip is getting progressively worse. It is hurting her at all times day and night. It is keeping her up at night. It is limiting what she can and cannot do. She is ready to proceed with the surgery. Risks and benefits of the surgery have been discussed with the patient and they elect to proceed with surgery.  There are on active contraindications to upcoming procedure such as ongoing infection or progressive neurological disease.  Problem List/Past Medical (Bowden Boody Monika Salk, III PA-C; 04/16/2014 2:54 PM) Primary osteoarthritis of right hip (M16.11) Migraine Headache Anxiety Disorder Depression Right Humerus Fracture  Allergies No Know Drug Allergies  Intolerance Codeine Derivatives -  Patient is able to take Hydrocodone  Family History  ([place], III PA-C; 04/16/2014 2:53 PM) Depression Maternal Grandfather. Hypertension Maternal Grandmother. Heart Disease Maternal Grandfather, Maternal Grandmother.  Social History (Remi Rester Monika Salk, III PA-C; 04/16/2014 2:53 PM) Marital status married Current drinker 02/25/2014: Currently drinks wine 5-7 times per week Tobacco use Former smoker. 02/25/2014: smoke(d) less than 1/2 pack(s) per day uses less than 1/2 can(s) smokeless per week Tobacco / smoke exposure 02/25/2014: no Under pain contract Exercise Exercises rarely; does other Living situation live with spouse Children 2 Current work status retired Number of flights of stairs before winded 2-3 No history of drug/alcohol rehab  Medication History (Davis Vannatter L Taylin Leder, III PA-C; 04/16/2014 3:18 PM) Norco (5-325MG  Tablet, 1-2 Oral po bid prn pain, Taken starting 04/07/2014) Active. Tylenol Extra Strength (500MG  Tablet, Oral) Active. Meloxicam (15MG  Tablet, Oral) Active. Escitalopram Oxalate (20MG  Tablet, Oral) Active.   Past Surgical History (Chritopher Coster Monika Salk, III PA-C; 04/16/2014 2:53 PM) Hysterectomy Date: 69.  Review of Systems (Jhostin Epps L. Ellanie Oppedisano III PA-C; 04/16/2014 3:12 PM) General Not Present- Chills, Fatigue, Fever, Memory Loss, Night Sweats, Weight Gain and Weight Loss. Skin Not Present- Eczema, Hives, Itching, Lesions and Rash. HEENT Not Present- Dentures, Double Vision, Headache, Hearing Loss, Tinnitus and Visual Loss. Respiratory Not Present- Allergies, Chronic Cough, Coughing up blood, Shortness of breath at rest and Shortness of breath with exertion. Cardiovascular Not Present- Chest Pain, Difficulty Breathing Lying Down, Murmur, Palpitations, Racing/skipping heartbeats and Swelling. Gastrointestinal Not Present- Abdominal Pain, Bloody Stool, Constipation, Diarrhea, Difficulty Swallowing, Heartburn, Jaundice, Loss of appetitie, Nausea and Vomiting. Female  Genitourinary Not Present- Blood in Urine, Discharge, Flank Pain, Incontinence, Painful Urination, Urgency, Urinary frequency, Urinary Retention, Urinating at Night and  Weak urinary stream. Musculoskeletal Present- Joint Pain. Not Present- Back Pain, Joint Swelling, Morning Stiffness, Muscle Pain, Muscle Weakness and Spasms. Neurological Not Present- Blackout spells, Difficulty with balance, Dizziness, Paralysis, Tremor and Weakness. Psychiatric Not Present- Insomnia.  Vitals  Weight: 163 lb Height: 63.5in Weight was reported by patient. Height was reported by patient. Body Surface Area: 1.78 m Body Mass Index: 28.42 kg/m  BP: 132/78 (Sitting, Right Arm, Standard)  Physical Exam (Vietta Bonifield L. Cleveland Yarbro III PA-C; 04/16/2014 3:25 PM) General Mental Status -Alert, cooperative and good historian. General Appearance-pleasant, Not in acute distress. Orientation-Oriented X3. Build & Nutrition-Well nourished and Well developed.  Head and Neck Head-normocephalic, atraumatic . Neck Global Assessment - supple, no bruit auscultated on the right, no bruit auscultated on the left.  Eye Vision-Wears contact lenses. Pupil - Bilateral-Regular and Round. Motion - Bilateral-EOMI.  Chest and Lung Exam Auscultation Breath sounds - clear at anterior chest wall and clear at posterior chest wall. Adventitious sounds - No Adventitious sounds.  Cardiovascular Auscultation Rhythm - Regular rate and rhythm. Heart Sounds - S1 WNL and S2 WNL. Murmurs & Other Heart Sounds - Auscultation of the heart reveals - No Murmurs.  Abdomen Palpation/Percussion Tenderness - Abdomen is non-tender to palpation. Rigidity (guarding) - Abdomen is soft. Auscultation Auscultation of the abdomen reveals - Bowel sounds normal.  Female Genitourinary Note: Not done, not pertinent to present illness   Musculoskeletal Note: On examination, she is alert and oriented in no apparent distress.  Evaluation of her right hip shows flexion at 90 degrees. No internal or external rotation. Abduction is about 20. Her left hip has normal motion with no discomfort. Gait pattern is antalgic on the right and she is utilizing a cane.  RADIOGRAPHS: X-rays AP pelvis and lateral of the right hip shows severe bone on bone arthritis with some lateral subluxation of the femoral head and slight flattening of the head. There is subchondral cystic formation.   Assessment & Plan (Aprille Sawhney L. Guadalupe Kerekes III PA-C; 04/16/2014 3:11 PM) Primary osteoarthritis of right hip (M16.11) Note:Plan is for a Right Total Hip Replacement - Anterior Approach  by Dr. Wynelle Link.  Plan is to go home.  PCP - Dr. Hall Busing - Patient has been seen preoperatively and felt to be stable for surgery.  The patient does not have any contraindications and will receive TXA (tranexamic acid) prior to surgery.  Signed electronically by Joelene Millin, III PA-C

## 2014-05-04 NOTE — Transfer of Care (Signed)
Immediate Anesthesia Transfer of Care Note  Patient: Tiffany Mcclain  Procedure(s) Performed: Procedure(s) (LRB): RIGHT TOTAL HIP ARTHROPLASTY ANTERIOR APPROACH (Right)  Patient Location: PACU  Anesthesia Type: Spinal  Level of Consciousness: sedated, patient cooperative and responds to stimulation  Airway & Oxygen Therapy: Patient Spontanous Breathing and Patient connected to face mask oxgen  Post-op Assessment: Report given to PACU RN and Post -op Vital signs reviewed and stable  Post vital signs: Reviewed and stable  Complications: No apparent anesthesia complications

## 2014-05-04 NOTE — Plan of Care (Signed)
Problem: Phase I Progression Outcomes Goal: CMS/Neurovascular status WDL Outcome: Completed/Met Date Met:  05/04/14 Goal: Pain controlled with appropriate interventions Outcome: Completed/Met Date Met:  05/04/14 Goal: Dangle or out of bed evening of surgery Outcome: Completed/Met Date Met:  05/04/14 Goal: Hemodynamically stable Outcome: Completed/Met Date Met:  05/04/14  Problem: Phase II Progression Outcomes Goal: Tolerating diet Outcome: Completed/Met Date Met:  05/04/14     

## 2014-05-04 NOTE — Progress Notes (Signed)
Utilization review completed.  

## 2014-05-04 NOTE — Anesthesia Postprocedure Evaluation (Signed)
  Anesthesia Post-op Note  Patient: Tiffany Mcclain  Procedure(s) Performed: Procedure(s) (LRB): RIGHT TOTAL HIP ARTHROPLASTY ANTERIOR APPROACH (Right)  Patient Location: PACU  Anesthesia Type: Spinal  Level of Consciousness: awake and alert   Airway and Oxygen Therapy: Patient Spontanous Breathing  Post-op Pain: mild  Post-op Assessment: Post-op Vital signs reviewed, Patient's Cardiovascular Status Stable, Respiratory Function Stable, Patent Airway and No signs of Nausea or vomiting  Last Vitals:  Filed Vitals:   05/04/14 1815  BP: 131/76  Pulse: 76  Temp: 36.4 C  Resp: 15    Post-op Vital Signs: stable   Complications: No apparent anesthesia complications

## 2014-05-05 ENCOUNTER — Encounter (HOSPITAL_COMMUNITY): Payer: Self-pay | Admitting: Orthopedic Surgery

## 2014-05-05 LAB — CBC
HCT: 32.4 % — ABNORMAL LOW (ref 36.0–46.0)
HEMOGLOBIN: 10.9 g/dL — AB (ref 12.0–15.0)
MCH: 34.3 pg — ABNORMAL HIGH (ref 26.0–34.0)
MCHC: 33.6 g/dL (ref 30.0–36.0)
MCV: 101.9 fL — ABNORMAL HIGH (ref 78.0–100.0)
PLATELETS: 260 10*3/uL (ref 150–400)
RBC: 3.18 MIL/uL — ABNORMAL LOW (ref 3.87–5.11)
RDW: 12.8 % (ref 11.5–15.5)
WBC: 9.3 10*3/uL (ref 4.0–10.5)

## 2014-05-05 LAB — BASIC METABOLIC PANEL
ANION GAP: 14 (ref 5–15)
BUN: 12 mg/dL (ref 6–23)
CHLORIDE: 99 meq/L (ref 96–112)
CO2: 23 meq/L (ref 19–32)
Calcium: 8.8 mg/dL (ref 8.4–10.5)
Creatinine, Ser: 0.63 mg/dL (ref 0.50–1.10)
GFR calc Af Amer: >90 mL/min (ref >90)
GFR calc non Af Amer: >90 mL/min (ref >90)
GLUCOSE: 169 mg/dL — AB (ref 70–99)
Potassium: 4 mEq/L (ref 3.7–5.3)
Sodium: 136 mEq/L — ABNORMAL LOW (ref 137–147)

## 2014-05-05 MED ORDER — RIVAROXABAN 10 MG PO TABS: 10.0000 mg | ORAL_TABLET | Freq: Every day | ORAL | Status: DC

## 2014-05-05 MED ORDER — OXYCODONE HCL 5 MG PO TABS: 5.0000 mg | ORAL_TABLET | ORAL | Status: DC | PRN

## 2014-05-05 MED ORDER — METHOCARBAMOL 500 MG PO TABS: 500.0000 mg | ORAL_TABLET | Freq: Four times a day (QID) | ORAL | Status: DC | PRN

## 2014-05-05 NOTE — Plan of Care (Signed)
Problem: Phase III Progression Outcomes Goal: Ambulates Outcome: Completed/Met Date Met:  05/05/14 Goal: Discharge plan remains appropriate-arrangements made Outcome: Completed/Met Date Met:  05/05/14

## 2014-05-05 NOTE — Discharge Summary (Signed)
Physician Discharge Summary   Patient ID: Tiffany Mcclain MRN: [id number] DOB/AGE: September 20, 1945 68 y.o.  Admit date: 05/04/2014 Discharge date: 05/06/2014  Primary Diagnosis:  Osteoarthritis of the Right hip.   Admission Diagnoses:  Past Medical History  Diagnosis Date  . Family history of adverse reaction to anesthesia     father combative/AMS after anethesia   . Anxiety   . Headache     hx of migraines   . Arthritis   . Depression   . Fracture of right humerus     hx of   . Shortness of breath dyspnea     climbing stairs  . Clubbed foot     left   Discharge Diagnoses:   Principal Problem:   OA (osteoarthritis) of hip  Estimated body mass index is 30.48 kg/(m^2) as calculated from the following:   Height as of this encounter: 5' 3"  (1.6 m).   Weight as of this encounter: 78.019 kg (172 lb).  Procedure(s) (LRB): RIGHT TOTAL HIP ARTHROPLASTY ANTERIOR APPROACH (Right)   Consults: None  HPI: Tiffany Mcclain is a 68 y.o. female who has advanced end-  stage arthritis of her Right hip with progressively worsening pain and  dysfunction.The patient has failed nonoperative management and presents for  total hip arthroplasty.   Laboratory Data: Admission on 05/04/2014, Discharged on 05/06/2014  Component Date Value Ref Range Status  . WBC 05/05/2014 9.3  4.0 - 10.5 K/uL Final  . RBC 05/05/2014 3.18* 3.87 - 5.11 MIL/uL Final  . Hemoglobin 05/05/2014 10.9* 12.0 - 15.0 g/dL Final  . HCT 05/05/2014 32.4* 36.0 - 46.0 % Final  . MCV 05/05/2014 101.9* 78.0 - 100.0 fL Final  . MCH 05/05/2014 34.3* 26.0 - 34.0 pg Final  . MCHC 05/05/2014 33.6  30.0 - 36.0 g/dL Final  . RDW 05/05/2014 12.8  11.5 - 15.5 % Final  . Platelets 05/05/2014 260  150 - 400 K/uL Final  . Sodium 05/05/2014 136* 137 - 147 mEq/L Final  . Potassium 05/05/2014 4.0  3.7 - 5.3 mEq/L Final  . Chloride 05/05/2014 99  96 - 112 mEq/L Final  . CO2 05/05/2014 23  19 - 32 mEq/L Final  . Glucose, Bld 05/05/2014  169* 70 - 99 mg/dL Final  . BUN 05/05/2014 12  6 - 23 mg/dL Final  . Creatinine, Ser 05/05/2014 0.63  0.50 - 1.10 mg/dL Final  . Calcium 05/05/2014 8.8  8.4 - 10.5 mg/dL Final  . GFR calc non Af Amer 05/05/2014 >90  >90 mL/min Final  . GFR calc Af Amer 05/05/2014 >90  >90 mL/min Final   Comment: (NOTE) The eGFR has been calculated using the CKD EPI equation. This calculation has not been validated in all clinical situations. eGFR's persistently <90 mL/min signify possible Chronic Kidney Disease.   . Anion gap 05/05/2014 14  5 - 15 Final  . WBC 05/06/2014 11.4* 4.0 - 10.5 K/uL Final  . RBC 05/06/2014 2.79* 3.87 - 5.11 MIL/uL Final  . Hemoglobin 05/06/2014 9.9* 12.0 - 15.0 g/dL Final  . HCT 05/06/2014 28.9* 36.0 - 46.0 % Final  . MCV 05/06/2014 103.6* 78.0 - 100.0 fL Final  . MCH 05/06/2014 35.5* 26.0 - 34.0 pg Final  . MCHC 05/06/2014 34.3  30.0 - 36.0 g/dL Final  . RDW 05/06/2014 13.3  11.5 - 15.5 % Final  . Platelets 05/06/2014 232  150 - 400 K/uL Final  . Sodium 05/06/2014 137  137 - 147 mEq/L Final  . Potassium 05/06/2014 4.7  3.7 -  5.3 mEq/L Final  . Chloride 05/06/2014 101  96 - 112 mEq/L Final  . CO2 05/06/2014 25  19 - 32 mEq/L Final  . Glucose, Bld 05/06/2014 123* 70 - 99 mg/dL Final  . BUN 05/06/2014 17  6 - 23 mg/dL Final  . Creatinine, Ser 05/06/2014 0.59  0.50 - 1.10 mg/dL Final  . Calcium 05/06/2014 9.0  8.4 - 10.5 mg/dL Final  . GFR calc non Af Amer 05/06/2014 >90  >90 mL/min Final  . GFR calc Af Amer 05/06/2014 >90  >90 mL/min Final   Comment: (NOTE) The eGFR has been calculated using the CKD EPI equation. This calculation has not been validated in all clinical situations. eGFR's persistently <90 mL/min signify possible Chronic Kidney Disease.   Georgiann Hahn gap 05/06/2014 11  5 - 15 Final  Hospital Outpatient Visit on 04/27/2014  Component Date Value Ref Range Status  . aPTT 04/27/2014 31  24 - 37 seconds Final  . WBC 04/27/2014 4.7  4.0 - 10.5 K/uL Final  . RBC  04/27/2014 4.03  3.87 - 5.11 MIL/uL Final  . Hemoglobin 04/27/2014 13.7  12.0 - 15.0 g/dL Final  . HCT 04/27/2014 41.9  36.0 - 46.0 % Final  . MCV 04/27/2014 104.0* 78.0 - 100.0 fL Final  . MCH 04/27/2014 34.0  26.0 - 34.0 pg Final  . MCHC 04/27/2014 32.7  30.0 - 36.0 g/dL Final  . RDW 04/27/2014 13.0  11.5 - 15.5 % Final  . Platelets 04/27/2014 274  150 - 400 K/uL Final  . Sodium 04/27/2014 139  137 - 147 mEq/L Final  . Potassium 04/27/2014 4.9  3.7 - 5.3 mEq/L Final  . Chloride 04/27/2014 101  96 - 112 mEq/L Final  . CO2 04/27/2014 27  19 - 32 mEq/L Final  . Glucose, Bld 04/27/2014 92  70 - 99 mg/dL Final  . BUN 04/27/2014 17  6 - 23 mg/dL Final  . Creatinine, Ser 04/27/2014 0.74  0.50 - 1.10 mg/dL Final  . Calcium 04/27/2014 9.7  8.4 - 10.5 mg/dL Final  . Total Protein 04/27/2014 7.2  6.0 - 8.3 g/dL Final  . Albumin 04/27/2014 3.9  3.5 - 5.2 g/dL Final  . AST 04/27/2014 21  0 - 37 U/L Final  . ALT 04/27/2014 19  0 - 35 U/L Final  . Alkaline Phosphatase 04/27/2014 67  39 - 117 U/L Final  . Total Bilirubin 04/27/2014 0.4  0.3 - 1.2 mg/dL Final  . GFR calc non Af Amer 04/27/2014 86* >90 mL/min Final  . GFR calc Af Amer 04/27/2014 >90  >90 mL/min Final   Comment: (NOTE) The eGFR has been calculated using the CKD EPI equation. This calculation has not been validated in all clinical situations. eGFR's persistently <90 mL/min signify possible Chronic Kidney Disease.   . Anion gap 04/27/2014 11  5 - 15 Final  . Prothrombin Time 04/27/2014 13.3  11.6 - 15.2 seconds Final  . INR 04/27/2014 1.00  0.00 - 1.49 Final  . Color, Urine 04/27/2014 YELLOW  YELLOW Final  . APPearance 04/27/2014 CLEAR  CLEAR Final  . Specific Gravity, Urine 04/27/2014 1.021  1.005 - 1.030 Final  . pH 04/27/2014 5.5  5.0 - 8.0 Final  . Glucose, UA 04/27/2014 NEGATIVE  NEGATIVE mg/dL Final  . Hgb urine dipstick 04/27/2014 NEGATIVE  NEGATIVE Final  . Bilirubin Urine 04/27/2014 NEGATIVE  NEGATIVE Final  . Ketones,  ur 04/27/2014 NEGATIVE  NEGATIVE mg/dL Final  . Protein, ur 04/27/2014 NEGATIVE  NEGATIVE  mg/dL Final  . Urobilinogen, UA 04/27/2014 0.2  0.0 - 1.0 mg/dL Final  . Nitrite 04/27/2014 NEGATIVE  NEGATIVE Final  . Leukocytes, UA 04/27/2014 NEGATIVE  NEGATIVE Final   MICROSCOPIC NOT DONE ON URINES WITH NEGATIVE PROTEIN, BLOOD, LEUKOCYTES, NITRITE, OR GLUCOSE <1000 mg/dL.  . ABO/RH(D) 04/27/2014 A POS   Final  . Antibody Screen 04/27/2014 NEG   Final  . Sample Expiration 04/27/2014 05/07/2014   Final  . MRSA, PCR 04/27/2014 NEGATIVE  NEGATIVE Final  . Staphylococcus aureus 04/27/2014 NEGATIVE  NEGATIVE Final   Comment:        The Xpert SA Assay (FDA approved for NASAL specimens in patients over 45 years of age), is one component of a comprehensive surveillance program.  Test performance has been validated by [employer] for patients greater than or equal to 64 year old. It is not intended to diagnose infection nor to guide or monitor treatment.   . ABO/RH(D) 04/27/2014 A POS   Final     X-Rays:Dg Hip Complete Right  04/27/2014   CLINICAL DATA:  Preop for right total hip replacement  EXAM: RIGHT HIP - COMPLETE 2+ VIEW  COMPARISON:  None.  FINDINGS: There is advanced degenerative joint disease of the right hip with complete loss of joint space, sclerosis, spurring, and subchondral cyst formation. Only mild degenerative change of the left hip is noted. The pelvic rami are intact. The SI joints appear corticated.  IMPRESSION: Advanced degenerative joint disease of the right hip.   Electronically Signed   By: Ivar Drape M.D.   On: 04/27/2014 11:37   Dg Pelvis Portable  05/04/2014   CLINICAL DATA:  Postoperative radiographs.  RIGHT hip replacement.  EXAM: PORTABLE PELVIS 1-2 VIEWS  COMPARISON:  04/27/2014.  FINDINGS: New uncomplicated RIGHT total hip arthroplasty. Expected postsurgical changes in the soft tissues with surgical drain. Distal stem visible an normal.  IMPRESSION: New  uncomplicated RIGHT total hip arthroplasty.   Electronically Signed   By: Dereck Ligas M.D.   On: 05/04/2014 17:04   Dg C-arm 61-120 Min-no Report  05/04/2014   CLINICAL DATA: surgery   C-ARM 61-120 MINUTES  Fluoroscopy was utilized by the requesting physician.  No radiographic  interpretation.     EKG:No orders found for this or any previous visit.   Hospital Course: Patient was admitted to [facility] and taken to the OR and underwent the above state procedure without complications.  Patient tolerated the procedure well and was later transferred to the recovery room and then to the orthopaedic floor for postoperative care.  They were given PO and IV analgesics for pain control following their surgery.  They were given 24 hours of postoperative antibiotics of  Anti-infectives    Start     Dose/Rate Route Frequency Ordered Stop   05/05/14 0600  ceFAZolin (ANCEF) IVPB 2 g/50 mL premix     2 g100 mL/hr over 30 Minutes Intravenous On call to O.R. 05/04/14 1225 05/04/14 1412   05/04/14 2000  ceFAZolin (ANCEF) IVPB 2 g/50 mL premix     2 g100 mL/hr over 30 Minutes Intravenous Every 6 hours 05/04/14 1729 05/05/14 0230     and started on DVT prophylaxis in the form of Xarelto.   PT and OT were ordered for total hip protocol.  The patient was allowed to be WBAT with therapy. Discharge planning was consulted to help with postop disposition and equipment needs.  Patient had a good night on the evening of surgery with deep  pain improved.  They started to get up OOB with therapy on day one.  Hemovac drain was pulled without difficulty.  Continued to work with therapy into day two.  Dressing was changed on day two and the incision was healing well.  Patient was seen in rounds and was ready to go home.  Discharge home with home health Diet - Regular diet Follow up - in 2 weeks Activity - WBAT Disposition - Home Condition Upon Discharge - Good D/C Meds - See DC Summary DVT Prophylaxis -  Xarelto       Discharge Instructions    Call MD / Call 911    Complete by:  As directed   If you experience chest pain or shortness of breath, CALL 911 and be transported to the hospital emergency room.  If you develope a fever above 101 F, pus (white drainage) or increased drainage or redness at the wound, or calf pain, call your surgeon's office.     Change dressing    Complete by:  As directed   You may change your dressing dressing daily with sterile 4 x 4 inch gauze dressing and paper tape.  Do not submerge the incision under water.     Constipation Prevention    Complete by:  As directed   Drink plenty of fluids.  Prune juice may be helpful.  You may use a stool softener, such as Colace (over the counter) 100 mg twice a day.  Use MiraLax (over the counter) for constipation as needed.     Diet - low sodium heart healthy    Complete by:  As directed      Discharge instructions    Complete by:  As directed   Pick up stool softner and laxative for home. Do not submerge incision under water. May shower. Continue to use ice for pain and swelling from surgery.  Total Hip Protocol.  Take Xarelto for two and a half more weeks, then discontinue Xarelto. Postoperative Constipation Protocol  Constipation - defined medically as fewer than three stools per week and severe constipation as less than one stool per week.  One of the most common issues patients have following surgery is constipation.  Even if you have a regular bowel pattern at home, your normal regimen is likely to be disrupted due to multiple reasons following surgery.  Combination of anesthesia, postoperative narcotics, change in appetite and fluid intake all can affect your bowels.  In order to avoid complications following surgery, here are some recommendations in order to help you during your recovery period.  Colace (docusate) - Pick up an over-the-counter form of Colace or another stool softener and take twice a day as  long as you are requiring postoperative pain medications.  Take with a full glass of water daily.  If you experience loose stools or diarrhea, hold the colace until you stool forms back up.  If your symptoms do not get better within 1 week or if they get worse, check with your doctor.  Dulcolax (bisacodyl) - Pick up over-the-counter and take as directed by the product packaging as needed to assist with the movement of your bowels.  Take with a full glass of water.  Use this product as needed if not relieved by Colace only.   MiraLax (polyethylene glycol) - Pick up over-the-counter to have on hand.  MiraLax is a solution that will increase the amount of water in your bowels to assist with bowel movements.  Take as directed and can  mix with a glass of water, juice, soda, coffee, or tea.  Take if you go more than two days without a movement. Do not use MiraLax more than once per day. Call your doctor if you are still constipated or irregular after using this medication for 7 days in a row.  If you continue to have problems with postoperative constipation, please contact the office for further assistance and recommendations.  If you experience "the worst abdominal pain ever" or develop nausea or vomiting, please contact the office immediatly for further recommendations for treatment.     Do not sit on low chairs, stoools or toilet seats, as it may be difficult to get up from low surfaces    Complete by:  As directed      Driving restrictions    Complete by:  As directed   No driving until released by the physician.     Increase activity slowly as tolerated    Complete by:  As directed      Lifting restrictions    Complete by:  As directed   No lifting until released by the physician.     Patient may shower    Complete by:  As directed   You may shower without a dressing once there is no drainage.  Do not wash over the wound.  If drainage remains, do not shower until drainage stops.     TED hose     Complete by:  As directed   Use stockings (TED hose) for 3 weeks on both leg(s).  You may remove them at night for sleeping.     Weight bearing as tolerated    Complete by:  As directed             Medication List    STOP taking these medications        HYDROcodone-acetaminophen 5-325 MG per tablet  Commonly known as:  NORCO/VICODIN     meloxicam 15 MG tablet  Commonly known as:  MOBIC      TAKE these medications        ALPRAZolam 0.25 MG tablet  Commonly known as:  XANAX  Take 0.25 mg by mouth 2 (two) times daily as needed for anxiety or sleep.     diphenhydrAMINE 25 MG tablet  Commonly known as:  SOMINEX  Take 25 mg by mouth 2 (two) times daily.     docusate sodium 100 MG capsule  Commonly known as:  COLACE  Take 100 mg by mouth every morning.     escitalopram 20 MG tablet  Commonly known as:  LEXAPRO  Take 20 mg by mouth every morning.     methocarbamol 500 MG tablet  Commonly known as:  ROBAXIN  Take 1 tablet (500 mg total) by mouth every 6 (six) hours as needed for muscle spasms.     oxyCODONE 5 MG immediate release tablet  Commonly known as:  Oxy IR/ROXICODONE  Take 1-2 tablets (5-10 mg total) by mouth every 3 (three) hours as needed for moderate pain, severe pain or breakthrough pain.     rivaroxaban 10 MG Tabs tablet  Commonly known as:  XARELTO  - Take 1 tablet (10 mg total) by mouth daily with breakfast. Take Xarelto for two and a half more weeks, then discontinue Xarelto.  - Once the patient has completed the blood thinner regimen, then take a Baby 81 mg Aspirin daily for three more weeks.     TYLENOL ARTHRITIS PAIN 650 MG CR tablet  Generic drug:  acetaminophen  Take 650 mg by mouth every 8 (eight) hours as needed for pain.       Follow-up Information    Follow up with [facility].   Why:  home health physical therapy   Contact information:   [address] [place] [address] [phone]       Follow up with  Gearlean Alf, MD. Schedule an appointment as soon as possible for a visit on 05/19/2014.   Specialty:  Orthopedic Surgery   Why:  Call office at [phone] to set up follow up appointment on Tuesday 05/19/2014.   Contact information:   [address] [place] [address] [phone]       Signed: Arlee Muslim, PA-C Orthopaedic Surgery 05/14/2014, 8:16 AM

## 2014-05-05 NOTE — Evaluation (Signed)
Occupational Therapy Evaluation Patient Details Name: Tiffany Mcclain MRN: [id number] DOB: 08/16/1945 Today's Date: 05/05/2014    History of Present Illness R DATHA   Clinical Impression   This 68 year old female was admitted for the above surgery. All education was completed.  Pt does not need any further OT at this time.    Follow Up Recommendations  No OT follow up    Equipment Recommendations  None recommended by OT    Recommendations for Other Services       Precautions / Restrictions Precautions Precautions: Fall Restrictions Weight Bearing Restrictions: No      Mobility Bed Mobility Overal bed mobility: Needs Assistance Bed Mobility: Sit to Supine     Supine to sit: Min assist     General bed mobility comments: assist for RLE  Transfers Overall transfer level: Needs assistance Equipment used: Rolling walker (2 wheeled) Transfers: Sit to/from Stand Sit to Stand: Min assist         General transfer comment: placed hands correctly without cues    Balance                                            ADL Overall ADL's : Needs assistance/impaired                         Toilet Transfer: Supervision/safety;Ambulation;BSC   Toileting- Water quality scientist and Hygiene: Supervision/safety;Sit to/from stand   Tub/ Shower Transfer: Min guard;Ambulation;Walk-in shower     General ADL Comments: Pt is able to complete UB adls with set up.  Husband has assisted with LB adls and will continue to do so.  Pt is not interested in AE.  Practiced bathroom transfers.  Pt backed into shower but she prefers to go forward using grab bars:  educated that backing in allows her to take more weight through her arms.       Vision                     Perception     Praxis      Pertinent Vitals/Pain Pain Assessment: 0-10 Pain Score: 5  Pain Location: R thigh Pain Descriptors / Indicators: Aching Pain Intervention(s): Limited  activity within patient's tolerance;Monitored during session;Ice applied     Hand Dominance     Extremity/Trunk Assessment Upper Extremity Assessment Upper Extremity Assessment: Overall WFL for tasks assessed          Communication Communication Communication: No difficulties   Cognition Arousal/Alertness: Awake/alert Behavior During Therapy: WFL for tasks assessed/performed Overall Cognitive Status: Within Functional Limits for tasks assessed                     General Comments       Exercises       Shoulder Instructions      Home Living Family/patient expects to be discharged to:: Private residence Living Arrangements: Spouse/significant other Available Help at Discharge: Family Type of Home: House Home Access: Stairs to enter [employer] of Steps: 5 Entrance Stairs-Rails: Left Home Layout: Two level;Full bath on main level;Able to live on main level with bedroom/bathroom     Bathroom Shower/Tub: Occupational psychologist: Standard     Home Equipment: Bedside commode;Shower seat;Grab bars - toilet;Grab bars - tub/shower   Additional Comments: 9 year old mother lives with  her and has all DME      Prior Functioning/Environment Level of Independence: Needs assistance        Comments: husband assisted with LB adls.    OT Diagnosis: Generalized weakness;Acute pain   OT Problem List:     OT Treatment/Interventions:      OT Goals(Current goals can be found in the care plan section) Acute Rehab OT Goals Patient Stated Goal: to go home, walk without pain  OT Frequency:     Barriers to D/C:            Co-evaluation              End of Session    Activity Tolerance: Patient tolerated treatment well Patient left: in bed;with call bell/phone within reach;with family/visitor present   Time: 1049-1106 OT Time Calculation (min): 17 min Charges:  OT General Charges $OT Visit: 1 Procedure OT Evaluation $Initial OT  Evaluation Tier I: 1 Procedure OT Treatments $Self Care/Home Management : 8-22 mins G-Codes:    Tiffany Mcclain 04-Jun-2014, 11:11 AM Lesle Chris, OTR/L [phone] 06/04/2014

## 2014-05-05 NOTE — Care Management Note (Signed)
    Page 1 of 2   05/05/2014     12:17:53 PM CARE MANAGEMENT NOTE 05/05/2014  Patient:  Tiffany Mcclain,Tiffany Mcclain   Account Number:  [id number]  Date Initiated:  05/05/2014  Documentation initiated by:  [employer]  Subjective/Objective Assessment:   adm: RIGHT TOTAL HIP ARTHROPLASTY ANTERIOR APPROACH (Right)     Action/Plan:   discharge planning   Anticipated DC Date:  05/06/2014   Anticipated DC Plan:  [place]  CM consult      [facility] Choice  HOME HEALTH   Choice offered to / List presented to:  C-1 Patient   DME arranged  NA      DME agency  NA     [place] arranged  HH-2 PT      [place]   Status of service:  Completed, signed off Medicare Important Message given?   (If response is "NO", the following Medicare IM given date fields will be blank) Date Medicare IM given:   Medicare IM given by:   Date Additional Medicare IM given:   Additional Medicare IM given by:    Discharge Disposition:  [place]  Per UR Regulation:    If discussed at Long Length of Stay Meetings, dates discussed:    Comments:  05/05/14 12:10 CM met with pt in room to offer choice of home health agency.  [facility] chooses [facility] HHPT. address and contact information verified with pt.  No DME needed as pt has all she needs at home.  Referral called to [facility] for HHPT.  NO other CM needs were communicated.  Mariane Masters, BSN, CM [phone].

## 2014-05-05 NOTE — Evaluation (Signed)
Physical Therapy Evaluation Patient Details Name: Tiffany Mcclain MRN: [id number] DOB: 1945-10-03 Today's Date: 05/05/2014   History of Present Illness  R DATHA  Clinical Impression  Patient tolerated ambulating very well,  Pt will benefit from PT to address problems listed in note below.    Follow Up Recommendations Home health PT;Supervision/Assistance - 24 hour    Equipment Recommendations  None recommended by PT    Recommendations for Other Services       Precautions / Restrictions Precautions Precautions: Fall Restrictions Weight Bearing Restrictions: No      Mobility  Bed Mobility Overal bed mobility: Needs Assistance Bed Mobility: Supine to Sit     Supine to sit: Min assist;HOB elevated     General bed mobility comments: cues for technique to right   Transfers Overall transfer level: Needs assistance Equipment used: Rolling walker (2 wheeled) Transfers: Sit to/from Stand Sit to Stand: Min assist         General transfer comment: cues for hand palcement, pt uses both hands on RW.  Ambulation/Gait Ambulation/Gait assistance: Min assist Ambulation Distance (Feet): 20 Feet Assistive device: Rolling walker (2 wheeled) Gait Pattern/deviations: Step-through pattern;Antalgic;Decreased step length - right;Decreased stance time - left     General Gait Details: cues for sequence and posture  Stairs            Wheelchair Mobility    Modified Rankin (Stroke Patients Only)       Balance                                             Pertinent Vitals/Pain Pain Assessment: 0-10 Pain Score: 5  Pain Location: R thigh Pain Descriptors / Indicators: Aching;Grimacing;Tightness Pain Intervention(s): Monitored during session;Premedicated before session;Ice applied    Home Living Family/patient expects to be discharged to:: Private residence Living Arrangements: Spouse/significant other Available Help at Discharge: Family Type of  Home: House Home Access: Stairs to enter Entrance Stairs-Rails: Left Entrance Stairs-Number of Steps: 5 Home Layout: Two level;Full bath on main level;Able to live on main level with bedroom/bathroom Home Equipment: Gilford Rile - 2 wheels;Bedside commode;Cane - single point      Prior Function Level of Independence: Independent with assistive device(s)         Comments: used RW PTA     Hand Dominance        Extremity/Trunk Assessment                 RLE Deficits / Details: able to advance  first       Communication   Communication: No difficulties  Cognition Arousal/Alertness: Awake/alert Behavior During Therapy: WFL for tasks assessed/performed Overall Cognitive Status: Within Functional Limits for tasks assessed                      General Comments      Exercises Total Joint Exercises Ankle Circles/Pumps: AROM;Both;10 reps;Supine Heel Slides: AAROM;Right;10 reps;Supine      Assessment/Plan    PT Assessment Patient needs continued PT services  PT Diagnosis Difficulty walking;Acute pain   PT Problem List Decreased strength;Decreased range of motion;Decreased activity tolerance;Decreased mobility;Decreased knowledge of precautions;Decreased safety awareness;Decreased knowledge of use of DME;Pain  PT Treatment Interventions DME instruction;Gait training;Stair training;Therapeutic activities;Functional mobility training;Therapeutic exercise;Patient/family education   PT Goals (Current goals can be found in the Care Plan section) Acute Rehab PT Goals Patient Stated  Goal: to go home, walk without pain PT Goal Formulation: With patient/family Time For Goal Achievement: 05/08/14 Potential to Achieve Goals: Good    Frequency 7X/week   Barriers to discharge        Co-evaluation               End of Session   Activity Tolerance: Patient tolerated treatment well Patient left: in chair;with call bell/phone within reach;with family/visitor  present Nurse Communication: Mobility status         Time: 1007-1030 PT Time Calculation (min) (ACUTE ONLY): 23 min   Charges:   PT Evaluation $Initial PT Evaluation Tier I: 1 Procedure PT Treatments $Gait Training: 8-22 mins $Therapeutic Exercise: 8-22 mins   PT G Codes:          Claretha Cooper 05/05/2014, 10:57 AM Tresa Endo PT [phone]

## 2014-05-05 NOTE — Progress Notes (Signed)
Physical Therapy Treatment Patient Details Name: Jodene Polyak MRN: [id number] DOB: Oct 09, 1945 Today's Date: 05/05/2014    History of Present Illness R DATHA    PT Comments    Pt was able to increase gait tolerance to 150 feet in hallway this tx.  Pt is supervision-min A with all mobility; she requires R LE A with bed mobility.     Follow Up Recommendations  Home health PT;Supervision/Assistance - 24 hour     Equipment Recommendations  None recommended by PT    Recommendations for Other Services       Precautions / Restrictions Precautions Precautions: Fall Restrictions Weight Bearing Restrictions: No    Mobility  Bed Mobility Overal bed mobility: Needs Assistance Bed Mobility: Supine to Sit;Sit to Supine     Supine to sit: Min assist Sit to supine: Min assist   General bed mobility comments: A for R LE; VCs for LLE A  Transfers Overall transfer level: Needs assistance Equipment used: Rolling walker (2 wheeled) Transfers: Sit to/from Stand Sit to Stand: Supervision         General transfer comment: x2 sit to stand; given A to bathroom during tx; VCs for sequencing  Ambulation/Gait Ambulation/Gait assistance: Min guard Ambulation Distance (Feet): 150 Feet Assistive device: Rolling walker (2 wheeled) Gait Pattern/deviations: Step-to pattern;Decreased stance time - right;Antalgic Gait velocity: decr   General Gait Details: VCs for sequencing and RW management   Stairs            Wheelchair Mobility    Modified Rankin (Stroke Patients Only)       Balance                                    Cognition Arousal/Alertness: Awake/alert Behavior During Therapy: WFL for tasks assessed/performed Overall Cognitive Status: Within Functional Limits for tasks assessed                      Exercises      General Comments        Pertinent Vitals/Pain Pain Assessment: 0-10 Pain Score: 3  Pain Location: R thigh Pain  Intervention(s): Monitored during session;Repositioned;Ice applied    Home Living                      Prior Function            PT Goals (current goals can now be found in the care plan section) Progress towards PT goals: Progressing toward goals    Frequency  7X/week    PT Plan Current plan remains appropriate    Co-evaluation             End of Session Equipment Utilized During Treatment: Gait belt Activity Tolerance: Patient tolerated treatment well Patient left: in bed;with call bell/phone within reach;with family/visitor present     Time: 1451-1510 PT Time Calculation (min) (ACUTE ONLY): 19 min  Charges:  $Gait Training: 8-22 mins                    G Codes:      Miller,Derrick, SPTA 05/05/2014, 4:25 PM   Reviewed above  Rica Koyanagi  PTA [facility]  Acute  Rehab Pager      [phone]

## 2014-05-05 NOTE — Discharge Instructions (Signed)
Information on my medicine - XARELTO® (Rivaroxaban) ° °This medication education was reviewed with me or my healthcare representative as part of my discharge preparation.  The pharmacist that spoke with me during my hospital stay was:  Honesti Seaberg George, RPH ° °Why was Xarelto® prescribed for you? °Xarelto® was prescribed for you to reduce the risk of blood clots forming after orthopedic surgery. The medical term for these abnormal blood clots is venous thromboembolism (VTE). ° °What do you need to know about xarelto® ? °Take your Xarelto® ONCE DAILY at the same time every day. °You may take it either with or without food. ° °If you have difficulty swallowing the tablet whole, you may crush it and mix in applesauce just prior to taking your dose. ° °Take Xarelto® exactly as prescribed by your doctor and DO NOT stop taking Xarelto® without talking to the doctor who prescribed the medication.  Stopping without other VTE prevention medication to take the place of Xarelto® may increase your risk of developing a clot. ° °After discharge, you should have regular check-up appointments with your healthcare provider that is prescribing your Xarelto®.   ° °What do you do if you miss a dose? °If you miss a dose, take it as soon as you remember on the same day then continue your regularly scheduled once daily regimen the next day. Do not take two doses of Xarelto® on the same day.  ° °Important Safety Information °A possible side effect of Xarelto® is bleeding. You should call your healthcare provider right away if you experience any of the following: °  Bleeding from an injury or your nose that does not stop. °  Unusual colored urine (red or dark brown) or unusual colored stools (red or black). °  Unusual bruising for unknown reasons. °  A serious fall or if you hit your head (even if there is no bleeding). ° °Some medicines may interact with Xarelto® and might increase your risk of bleeding while on Xarelto®. To help  avoid this, consult your healthcare provider or pharmacist prior to using any new prescription or non-prescription medications, including herbals, vitamins, non-steroidal anti-inflammatory drugs (NSAIDs) and supplements. ° °This website has more information on Xarelto®: www.xarelto.com. ° ° ° °

## 2014-05-05 NOTE — Plan of Care (Signed)
Problem: Phase III Progression Outcomes Goal: Pain controlled on oral analgesia Outcome: Completed/Met Date Met:  05/05/14     

## 2014-05-05 NOTE — Progress Notes (Signed)
   Subjective: 1 Day Post-Op Procedure(s) (LRB): RIGHT TOTAL HIP ARTHROPLASTY ANTERIOR APPROACH (Right) Patient reports pain as mild.   Patient seen in rounds with Dr. Wynelle Link. Husband in room at bedside.  Her deep pain has improved. Patient is well, but has had some minor complaints of pain in the hip and thigh, requiring pain medications We will start therapy today.  Plan is to go Home after hospital stay.  Objective: Vital signs in last 24 hours: Temp:  [97.5 F (36.4 C)-98.3 F (36.8 C)] 97.6 F (36.4 C) (12/01 0502) Pulse Rate:  [70-84] 83 (12/01 0502) Resp:  [14-22] 16 (12/01 0502) BP: (104-168)/(64-91) 119/73 mmHg (12/01 0502) SpO2:  [96 %-100 %] 97 % (12/01 0502) Weight:  [78.019 kg (172 lb)] 78.019 kg (172 lb) (11/30 1715)  Intake/Output from previous day:  Intake/Output Summary (Last 24 hours) at 05/05/14 0801 Last data filed at 05/05/14 0700  Gross per 24 hour  Intake 4118.75 ml  Output   2840 ml  Net 1278.75 ml    Intake/Output this shift: UOP 650 since MN  Labs:  Recent Labs  05/05/14 0507  HGB 10.9*    Recent Labs  05/05/14 0507  WBC 9.3  RBC 3.18*  HCT 32.4*  PLT 260    Recent Labs  05/05/14 0507  NA 136*  K 4.0  CL 99  CO2 23  BUN 12  CREATININE 0.63  GLUCOSE 169*  CALCIUM 8.8   No results for input(s): LABPT, INR in the last 72 hours.  EXAM General - Patient is Alert, Appropriate and Oriented Extremity - Neurovascular intact Sensation intact distally Dorsiflexion/Plantar flexion intact Dressing - dressing C/D/I Motor Function - intact, moving foot and toes well on exam.  Hemovac pulled without difficulty.  Past Medical History  Diagnosis Date  . Family history of adverse reaction to anesthesia     father combative/AMS after anethesia   . Anxiety   . Headache     hx of migraines   . Arthritis   . Depression   . Fracture of right humerus     hx of   . Shortness of breath dyspnea     climbing stairs  . Clubbed foot       left    Assessment/Plan: 1 Day Post-Op Procedure(s) (LRB): RIGHT TOTAL HIP ARTHROPLASTY ANTERIOR APPROACH (Right) Principal Problem:   OA (osteoarthritis) of hip  Estimated body mass index is 30.48 kg/(m^2) as calculated from the following:   Height as of this encounter: 5\' 3"  (1.6 m).   Weight as of this encounter: 78.019 kg (172 lb). Advance diet Up with therapy Plan for discharge tomorrow Discharge home with home health  DVT Prophylaxis - Xarelto Weight Bearing As Tolerated right Leg Hemovac Pulled Begin Therapy   Arlee Muslim, PA-C Orthopaedic Surgery 05/05/2014, 8:01 AM

## 2014-05-06 LAB — BASIC METABOLIC PANEL
Anion gap: 11 (ref 5–15)
BUN: 17 mg/dL (ref 6–23)
CO2: 25 mEq/L (ref 19–32)
Calcium: 9 mg/dL (ref 8.4–10.5)
Chloride: 101 mEq/L (ref 96–112)
Creatinine, Ser: 0.59 mg/dL (ref 0.50–1.10)
Glucose, Bld: 123 mg/dL — ABNORMAL HIGH (ref 70–99)
POTASSIUM: 4.7 meq/L (ref 3.7–5.3)
SODIUM: 137 meq/L (ref 137–147)

## 2014-05-06 LAB — CBC
HEMATOCRIT: 28.9 % — AB (ref 36.0–46.0)
HEMOGLOBIN: 9.9 g/dL — AB (ref 12.0–15.0)
MCH: 35.5 pg — ABNORMAL HIGH (ref 26.0–34.0)
MCHC: 34.3 g/dL (ref 30.0–36.0)
MCV: 103.6 fL — ABNORMAL HIGH (ref 78.0–100.0)
Platelets: 232 10*3/uL (ref 150–400)
RBC: 2.79 MIL/uL — ABNORMAL LOW (ref 3.87–5.11)
RDW: 13.3 % (ref 11.5–15.5)
WBC: 11.4 10*3/uL — AB (ref 4.0–10.5)

## 2014-05-06 NOTE — Progress Notes (Signed)
   Subjective: 2 Days Post-Op Procedure(s) (LRB): RIGHT TOTAL HIP ARTHROPLASTY ANTERIOR APPROACH (Right) Patient reports pain as mild.   Patient seen in rounds with Dr. Wynelle Link. Patient is well, and has had no acute complaints or problems Patient is ready to go home  Objective: Vital signs in last 24 hours: Temp:  [98.2 F (36.8 C)-98.6 F (37 C)] 98.3 F (36.8 C) (12/02 0610) Pulse Rate:  [81-105] 86 (12/02 0610) Resp:  [14-18] 16 (12/02 0610) BP: (108-152)/(65-72) 152/71 mmHg (12/02 0610) SpO2:  [94 %-98 %] 98 % (12/02 0610)  Intake/Output from previous day:  Intake/Output Summary (Last 24 hours) at 05/06/14 0802 Last data filed at 05/06/14 1572  Gross per 24 hour  Intake    817 ml  Output   1450 ml  Net   -633 ml    Labs:  Recent Labs  05/05/14 0507 05/06/14 0423  HGB 10.9* 9.9*    Recent Labs  05/05/14 0507 05/06/14 0423  WBC 9.3 11.4*  RBC 3.18* 2.79*  HCT 32.4* 28.9*  PLT 260 232    Recent Labs  05/05/14 0507 05/06/14 0423  NA 136* 137  K 4.0 4.7  CL 99 101  CO2 23 25  BUN 12 17  CREATININE 0.63 0.59  GLUCOSE 169* 123*  CALCIUM 8.8 9.0   No results for input(s): LABPT, INR in the last 72 hours.  EXAM: General - Patient is Alert, Appropriate and Oriented Extremity - Neurovascular intact Sensation intact distally Dorsiflexion/Plantar flexion intact Incision - clean, dry, no drainage, healing Motor Function - intact, moving foot and toes well on exam.   Assessment/Plan: 2 Days Post-Op Procedure(s) (LRB): RIGHT TOTAL HIP ARTHROPLASTY ANTERIOR APPROACH (Right) Procedure(s) (LRB): RIGHT TOTAL HIP ARTHROPLASTY ANTERIOR APPROACH (Right) Past Medical History  Diagnosis Date  . Family history of adverse reaction to anesthesia     father combative/AMS after anethesia   . Anxiety   . Headache     hx of migraines   . Arthritis   . Depression   . Fracture of right humerus     hx of   . Shortness of breath dyspnea     climbing stairs  .  Clubbed foot     left   Principal Problem:   OA (osteoarthritis) of hip  Estimated body mass index is 30.48 kg/(m^2) as calculated from the following:   Height as of this encounter: 5\' 3"  (1.6 m).   Weight as of this encounter: 78.019 kg (172 lb). Up with therapy Discharge home with home health Diet - Regular diet Follow up - in 2 weeks Activity - WBAT Disposition - Home Condition Upon Discharge - Good D/C Meds - See DC Summary DVT Prophylaxis - Xarelto  Arlee Muslim, PA-C Orthopaedic Surgery 05/06/2014, 8:02 AM

## 2014-05-06 NOTE — Progress Notes (Signed)
Physical Therapy Treatment Patient Details Name: Britnay Magnussen MRN: [id number] DOB: [birth year] Today's Date: 05/06/2014    History of Present Illness R DATHA    PT Comments    Pt was able to complete stair training this tx; she was unsteady using the handrail and crutch but was much safer using spouse handheld A.  Pt and and spouse were given THA exercise packet and educated.    Follow Up Recommendations  Home health PT;Supervision/Assistance - 24 hour     Equipment Recommendations  None recommended by PT    Recommendations for Other Services       Precautions / Restrictions Precautions Precautions: Fall Restrictions Weight Bearing Restrictions: No    Mobility  Bed Mobility Overal bed mobility: Needs Assistance Bed Mobility: Supine to Sit     Supine to sit: Min assist     General bed mobility comments: A for RLE to EOB  Transfers Overall transfer level: Needs assistance Equipment used: Rolling walker (2 wheeled) Transfers: Sit to/from Stand Sit to Stand: Supervision         General transfer comment: x2 sit to stand; given A to bathroom during tx; VCs for sequencing  Ambulation/Gait Ambulation/Gait assistance: Min guard Ambulation Distance (Feet): 15 Feet Assistive device: Rolling walker (2 wheeled) Gait Pattern/deviations: Step-to pattern;Antalgic Gait velocity: decr   General Gait Details: limited distance d/t stair training    Stairs Stairs: Yes Stairs assistance: Min assist Stair Management: One rail Left;Step to pattern;Forwards;With crutches (with spouse handheld A) Number of Stairs: 8 General stair comments: pt completed 4 steps with crutch first and was very unsteady/unsafe; she required min-mod cueing for sequencing; performed 4 steps with spouse handheld A and was much improved  Wheelchair Mobility    Modified Rankin (Stroke Patients Only)       Balance                                    Cognition  Arousal/Alertness: Awake/alert Behavior During Therapy: WFL for tasks assessed/performed Overall Cognitive Status: Within Functional Limits for tasks assessed                      Exercises      General Comments        Pertinent Vitals/Pain Pain Assessment: 0-10 Pain Score: 5  Pain Location: R thigh Pain Intervention(s): Limited activity within patient's tolerance;Monitored during session;Repositioned;Ice applied    Home Living                      Prior Function            PT Goals (current goals can now be found in the care plan section) Progress towards PT goals: Progressing toward goals    Frequency  7X/week    PT Plan Current plan remains appropriate    Co-evaluation             End of Session Equipment Utilized During Treatment: Gait belt Activity Tolerance: Patient tolerated treatment well Patient left: in chair;with call bell/phone within reach;with family/visitor present     Time: 1139-1206 PT Time Calculation (min) (ACUTE ONLY): 27 min  Charges:                       G Codes:      Miller,Derrick, SPTA 05/06/2014, 1:00 PM  Reviewed above  Rica Koyanagi  PTA [facility]  Acute  Rehab Pager      [phone]

## 2014-05-06 NOTE — Progress Notes (Signed)
RN reviewed discharge instructions with patient and family. All questions answered.  Paperwork and prescriptions given.   NT rolled patient down in wheelchair to family car.  

## 2014-06-13 ENCOUNTER — Ambulatory Visit: Payer: Self-pay | Admitting: Orthopedic Surgery

## 2014-06-26 ENCOUNTER — Inpatient Hospital Stay (HOSPITAL_COMMUNITY): Admit: 2014-06-26 | Payer: Self-pay | Admitting: Orthopedic Surgery

## 2014-06-26 ENCOUNTER — Encounter (HOSPITAL_COMMUNITY): Payer: Self-pay

## 2014-06-26 SURGERY — ARTHROPLASTY, HIP, TOTAL, ANTERIOR APPROACH
Anesthesia: Choice | Site: Hip | Laterality: Right

## 2016-02-19 IMAGING — CR DG HIP COMPLETE 2+V*R*
3 series · 3 of 3 positions shown · non-contrast
Comparison: None.

CLINICAL DATA: Preop for right total hip replacement

EXAM:
RIGHT HIP - COMPLETE 2+ VIEW

[t pelvis a.p.]
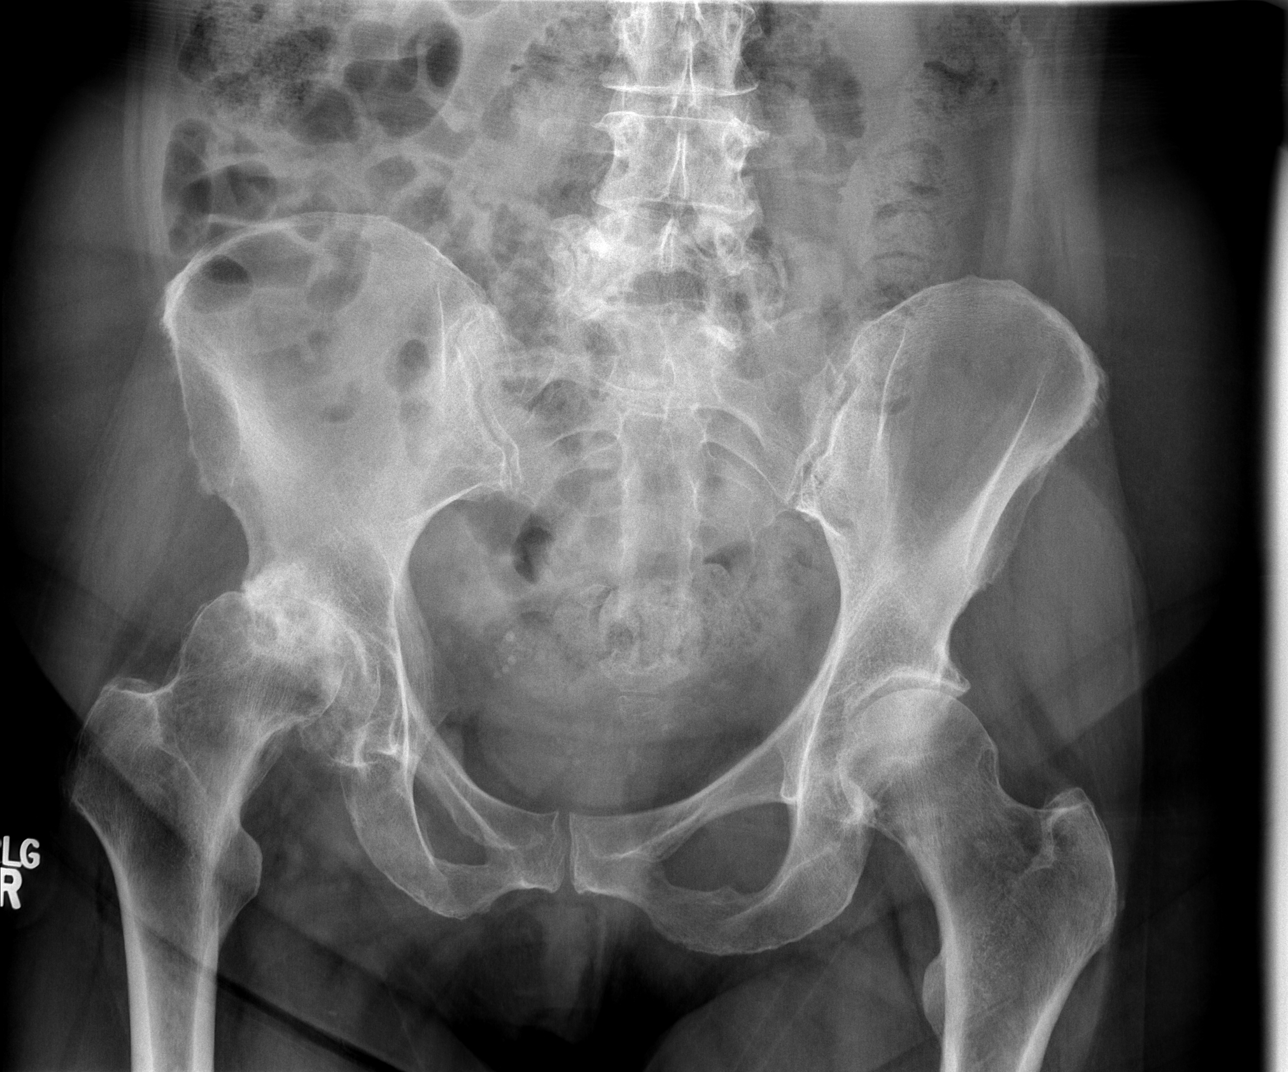

[t hip ap right]
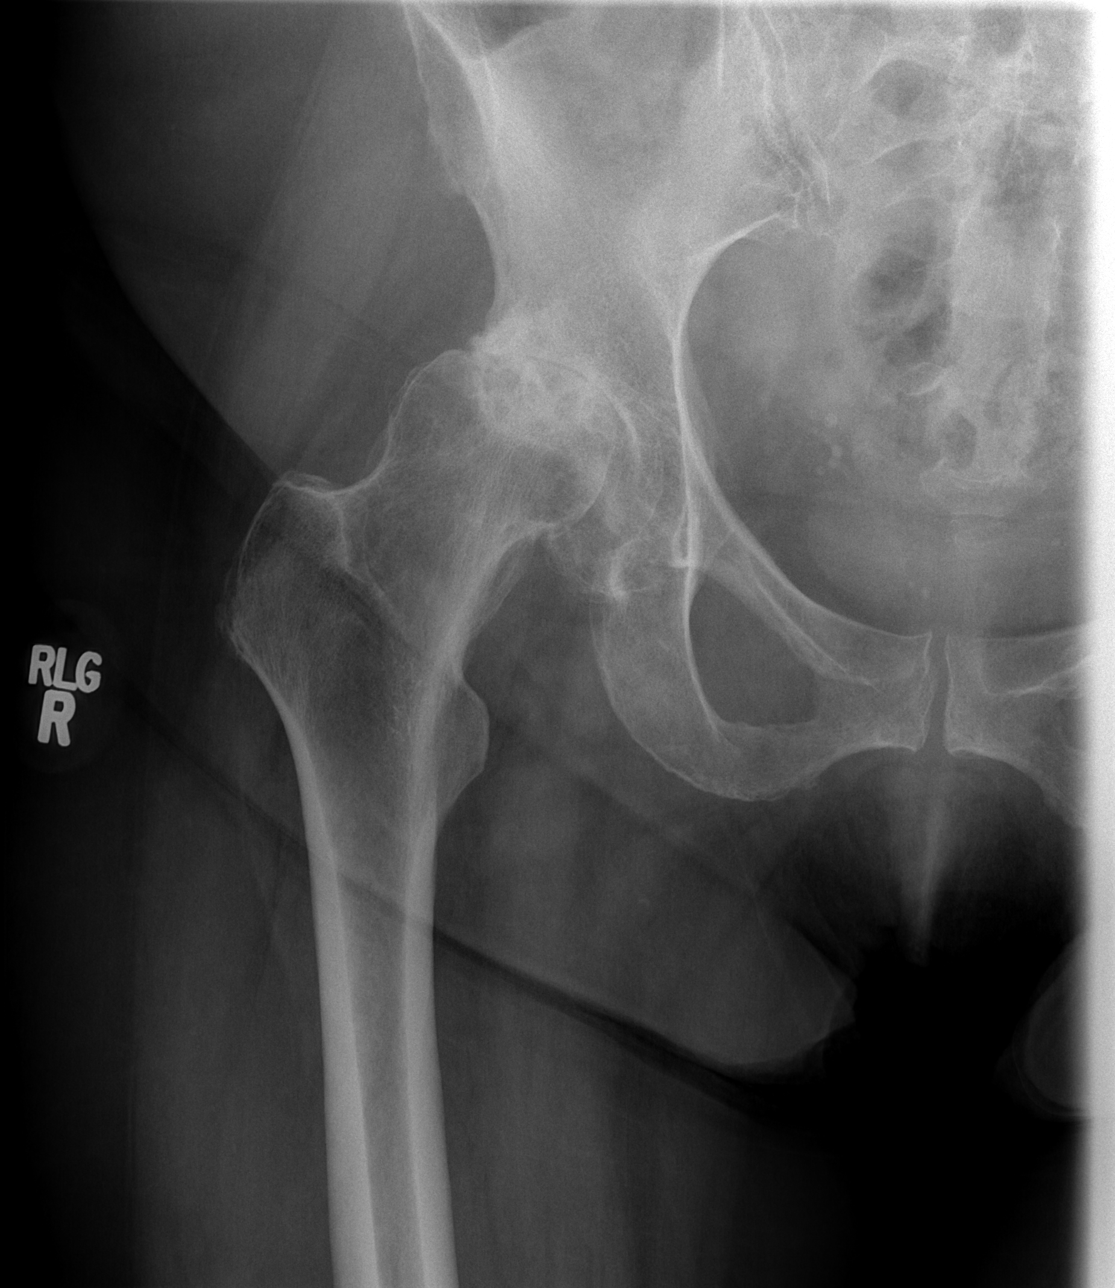

[t hip frog leg right]
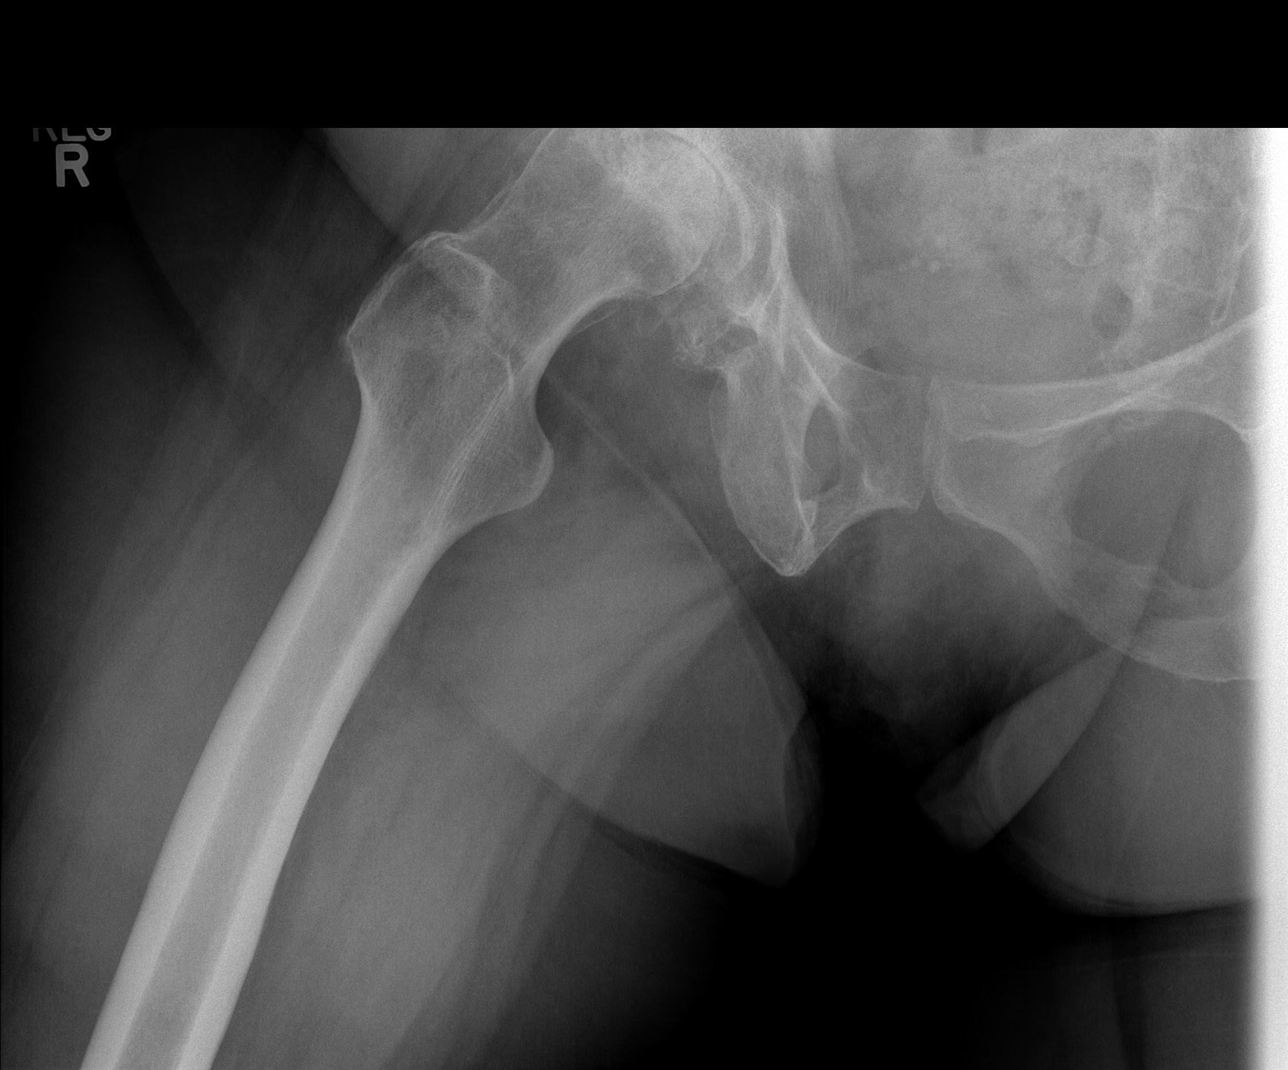

[3 of 3 positions shown; findings below may reference images not displayed]

FINDINGS: There is advanced degenerative joint disease of the right hip with
complete loss of joint space, sclerosis, spurring, and subchondral
cyst formation. Only mild degenerative change of the left hip is
noted. The pelvic rami are intact. The SI joints appear corticated.
IMPRESSION: Advanced degenerative joint disease of the right hip.

## 2016-10-03 ENCOUNTER — Inpatient Hospital Stay: Payer: Medicare Other | Admitting: Anesthesiology

## 2016-10-03 ENCOUNTER — Encounter
Admission: EM | Disposition: A | Discharge status: Home / Self Care | Payer: Self-pay | Source: Home / Self Care | Attending: Internal Medicine

## 2016-10-03 ENCOUNTER — Encounter: Payer: Self-pay | Admitting: Emergency Medicine

## 2016-10-03 ENCOUNTER — Emergency Department: Payer: Medicare Other

## 2016-10-03 ENCOUNTER — Inpatient Hospital Stay
Admission: EM | Admit: 2016-10-03 | Discharge: 2016-10-06 | DRG: 872 | Disposition: A | Discharge status: Home / Self Care | Payer: Medicare Other | Attending: Internal Medicine | Admitting: Internal Medicine

## 2016-10-03 DIAGNOSIS — N12 Tubulo-interstitial nephritis, not specified as acute or chronic: Secondary | ICD-10-CM | POA: Diagnosis not present

## 2016-10-03 DIAGNOSIS — D500 Iron deficiency anemia secondary to blood loss (chronic): Secondary | ICD-10-CM | POA: Diagnosis not present

## 2016-10-03 DIAGNOSIS — K269 Duodenal ulcer, unspecified as acute or chronic, without hemorrhage or perforation: Secondary | ICD-10-CM

## 2016-10-03 DIAGNOSIS — Z90710 Acquired absence of both cervix and uterus: Secondary | ICD-10-CM | POA: Diagnosis not present

## 2016-10-03 DIAGNOSIS — M1990 Unspecified osteoarthritis, unspecified site: Secondary | ICD-10-CM | POA: Diagnosis present

## 2016-10-03 DIAGNOSIS — D509 Iron deficiency anemia, unspecified: Secondary | ICD-10-CM | POA: Diagnosis not present

## 2016-10-03 DIAGNOSIS — Z792 Long term (current) use of antibiotics: Secondary | ICD-10-CM | POA: Diagnosis not present

## 2016-10-03 DIAGNOSIS — Z96641 Presence of right artificial hip joint: Secondary | ICD-10-CM | POA: Diagnosis present

## 2016-10-03 DIAGNOSIS — Z7901 Long term (current) use of anticoagulants: Secondary | ICD-10-CM | POA: Diagnosis not present

## 2016-10-03 DIAGNOSIS — R1031 Right lower quadrant pain: Secondary | ICD-10-CM | POA: Diagnosis present

## 2016-10-03 DIAGNOSIS — E876 Hypokalemia: Secondary | ICD-10-CM | POA: Diagnosis present

## 2016-10-03 DIAGNOSIS — D649 Anemia, unspecified: Secondary | ICD-10-CM

## 2016-10-03 DIAGNOSIS — F419 Anxiety disorder, unspecified: Secondary | ICD-10-CM | POA: Diagnosis present

## 2016-10-03 DIAGNOSIS — Z79899 Other long term (current) drug therapy: Secondary | ICD-10-CM

## 2016-10-03 DIAGNOSIS — N201 Calculus of ureter: Secondary | ICD-10-CM | POA: Diagnosis not present

## 2016-10-03 DIAGNOSIS — R0609 Other forms of dyspnea: Secondary | ICD-10-CM | POA: Diagnosis not present

## 2016-10-03 DIAGNOSIS — N289 Disorder of kidney and ureter, unspecified: Secondary | ICD-10-CM

## 2016-10-03 DIAGNOSIS — A419 Sepsis, unspecified organism: Secondary | ICD-10-CM | POA: Diagnosis present

## 2016-10-03 DIAGNOSIS — Z87891 Personal history of nicotine dependence: Secondary | ICD-10-CM | POA: Diagnosis not present

## 2016-10-03 DIAGNOSIS — N136 Pyonephrosis: Secondary | ICD-10-CM | POA: Diagnosis present

## 2016-10-03 DIAGNOSIS — K449 Diaphragmatic hernia without obstruction or gangrene: Secondary | ICD-10-CM | POA: Diagnosis present

## 2016-10-03 DIAGNOSIS — F329 Major depressive disorder, single episode, unspecified: Secondary | ICD-10-CM | POA: Diagnosis present

## 2016-10-03 DIAGNOSIS — K2970 Gastritis, unspecified, without bleeding: Secondary | ICD-10-CM | POA: Diagnosis present

## 2016-10-03 DIAGNOSIS — Z9889 Other specified postprocedural states: Secondary | ICD-10-CM | POA: Diagnosis not present

## 2016-10-03 DIAGNOSIS — R6520 Severe sepsis without septic shock: Secondary | ICD-10-CM

## 2016-10-03 DIAGNOSIS — R509 Fever, unspecified: Secondary | ICD-10-CM

## 2016-10-03 DIAGNOSIS — R231 Pallor: Secondary | ICD-10-CM | POA: Diagnosis not present

## 2016-10-03 DIAGNOSIS — R739 Hyperglycemia, unspecified: Secondary | ICD-10-CM | POA: Diagnosis present

## 2016-10-03 HISTORY — DX: Duodenal ulcer, unspecified as acute or chronic, without hemorrhage or perforation: K26.9

## 2016-10-03 HISTORY — PX: CYSTOSCOPY WITH STENT PLACEMENT: SHX5790

## 2016-10-03 HISTORY — DX: Tubulo-interstitial nephritis, not specified as acute or chronic: N12

## 2016-10-03 LAB — CBC WITH DIFFERENTIAL/PLATELET
Basophils Absolute: 0 10*3/uL (ref 0–0.1)
Basophils Relative: 0 %
Eosinophils Absolute: 0 10*3/uL (ref 0–0.7)
Eosinophils Relative: 0 %
HEMATOCRIT: 20.7 % — AB (ref 35.0–47.0)
HEMOGLOBIN: 6.2 g/dL — AB (ref 12.0–16.0)
LYMPHS ABS: 0.3 10*3/uL — AB (ref 1.0–3.6)
LYMPHS PCT: 3 %
MCH: 21.5 pg — AB (ref 26.0–34.0)
MCHC: 29.9 g/dL — ABNORMAL LOW (ref 32.0–36.0)
MCV: 71.9 fL — AB (ref 80.0–100.0)
Monocytes Absolute: 0.3 10*3/uL (ref 0.2–0.9)
Monocytes Relative: 3 %
NEUTROS PCT: 94 %
Neutro Abs: 8.6 10*3/uL — ABNORMAL HIGH (ref 1.4–6.5)
Platelets: 261 10*3/uL (ref 150–440)
RBC: 2.88 MIL/uL — AB (ref 3.80–5.20)
RDW: 18.9 % — ABNORMAL HIGH (ref 11.5–14.5)
WBC: 9.2 10*3/uL (ref 3.6–11.0)

## 2016-10-03 LAB — PROCALCITONIN: Procalcitonin: 14.61 ng/mL

## 2016-10-03 LAB — PROTIME-INR
INR: 1.25
Prothrombin Time: 15.8 seconds — ABNORMAL HIGH (ref 11.4–15.2)

## 2016-10-03 LAB — COMPREHENSIVE METABOLIC PANEL
ALBUMIN: 3.3 g/dL — AB (ref 3.5–5.0)
ALT: 1282 U/L — ABNORMAL HIGH (ref 14–54)
ANION GAP: 12 (ref 5–15)
AST: 49 U/L — ABNORMAL HIGH (ref 15–41)
Alkaline Phosphatase: 87 U/L (ref 38–126)
BUN: 11 mg/dL (ref 6–20)
CO2: 18 mmol/L — AB (ref 22–32)
Calcium: 8.5 mg/dL — ABNORMAL LOW (ref 8.9–10.3)
Chloride: 105 mmol/L (ref 101–111)
Creatinine, Ser: 1.01 mg/dL — ABNORMAL HIGH (ref 0.44–1.00)
GFR calc Af Amer: >60 mL/min (ref >60)
GFR calc non Af Amer: 55 mL/min — ABNORMAL LOW (ref >60)
GLUCOSE: 197 mg/dL — AB (ref 65–99)
POTASSIUM: 3 mmol/L — AB (ref 3.5–5.1)
SODIUM: 135 mmol/L (ref 135–145)
Total Bilirubin: 1 mg/dL (ref 0.3–1.2)
Total Protein: 6.5 g/dL (ref 6.5–8.1)

## 2016-10-03 LAB — URINALYSIS, COMPLETE (UACMP) WITH MICROSCOPIC
BILIRUBIN URINE: NEGATIVE
Bacteria, UA: NONE SEEN
Hgb urine dipstick: NEGATIVE
KETONES UR: 5 mg/dL — AB
Leukocytes, UA: NEGATIVE
NITRITE: NEGATIVE
PH: 5 (ref 5.0–8.0)
Protein, ur: 100 mg/dL — AB
Specific Gravity, Urine: 1.015 (ref 1.005–1.030)

## 2016-10-03 LAB — HEMOGLOBIN AND HEMATOCRIT, BLOOD
HCT: 26.2 % — ABNORMAL LOW (ref 35.0–47.0)
HEMATOCRIT: 18.8 % — AB (ref 35.0–47.0)
Hemoglobin: 5.5 g/dL — ABNORMAL LOW (ref 12.0–16.0)
Hemoglobin: 8.3 g/dL — ABNORMAL LOW (ref 12.0–16.0)

## 2016-10-03 LAB — LACTIC ACID, PLASMA
LACTIC ACID, VENOUS: 6 mmol/L — AB (ref 0.5–1.9)
Lactic Acid, Venous: 2.1 mmol/L (ref 0.5–1.9)

## 2016-10-03 LAB — ABO/RH: ABO/RH(D): A POS

## 2016-10-03 LAB — PREPARE RBC (CROSSMATCH)

## 2016-10-03 LAB — TROPONIN I: Troponin I: <0.03 ng/mL (ref <0.03)

## 2016-10-03 LAB — CK: CK TOTAL: 27 U/L — AB (ref 38–234)

## 2016-10-03 LAB — FERRITIN: FERRITIN: 12 ng/mL (ref 11–307)

## 2016-10-03 LAB — IRON AND TIBC
Iron: 6 ug/dL — ABNORMAL LOW (ref 28–170)
SATURATION RATIOS: 1 % — AB (ref 10.4–31.8)
TIBC: 448 ug/dL (ref 250–450)
UIBC: 442 ug/dL

## 2016-10-03 LAB — LIPASE, BLOOD: Lipase: 21 U/L (ref 11–51)

## 2016-10-03 LAB — ACETAMINOPHEN LEVEL

## 2016-10-03 LAB — APTT: APTT: 31 s (ref 24–36)

## 2016-10-03 SURGERY — CYSTOSCOPY, WITH STENT INSERTION
Anesthesia: General | Site: Urethra | Laterality: Right | Wound class: Clean Contaminated

## 2016-10-03 MED ORDER — IOPAMIDOL (ISOVUE-300) INJECTION 61%: 30.0000 mL | Freq: Once | INTRAVENOUS | Status: DC | PRN

## 2016-10-03 MED ORDER — CEFTRIAXONE SODIUM IN DEXTROSE 20 MG/ML IV SOLN
1.0000 g | INTRAVENOUS | Status: DC
  Administered 2016-10-05: 1 g via INTRAVENOUS
  Filled 2016-10-03 (×2): qty 50

## 2016-10-03 MED ORDER — ESCITALOPRAM OXALATE 10 MG PO TABS
20.0000 mg | ORAL_TABLET | Freq: Every morning | ORAL | Status: DC
  Administered 2016-10-04 – 2016-10-06 (×3): 20 mg via ORAL
  Filled 2016-10-03 (×5): qty 2

## 2016-10-03 MED ORDER — VANCOMYCIN HCL IN DEXTROSE 1-5 GM/200ML-% IV SOLN
1000.0000 mg | Freq: Once | INTRAVENOUS | Status: AC
  Administered 2016-10-03: 1000 mg via INTRAVENOUS
  Filled 2016-10-03: qty 200

## 2016-10-03 MED ORDER — MORPHINE SULFATE (PF) 2 MG/ML IV SOLN
2.0000 mg | INTRAVENOUS | Status: DC | PRN
  Administered 2016-10-03: 2 mg via INTRAVENOUS
  Filled 2016-10-03: qty 1

## 2016-10-03 MED ORDER — ACETAMINOPHEN 325 MG PO TABS
650.0000 mg | ORAL_TABLET | Freq: Once | ORAL | Status: AC
  Administered 2016-10-03: 650 mg via ORAL

## 2016-10-03 MED ORDER — FENTANYL CITRATE (PF) 100 MCG/2ML IJ SOLN: 25.0000 ug | INTRAMUSCULAR | Status: DC | PRN

## 2016-10-03 MED ORDER — SODIUM CHLORIDE 0.9 % IV SOLN: 250.0000 mL | INTRAVENOUS | Status: DC | PRN

## 2016-10-03 MED ORDER — RIVAROXABAN 10 MG PO TABS
10.0000 mg | ORAL_TABLET | Freq: Every day | ORAL | Status: DC
  Filled 2016-10-03: qty 1

## 2016-10-03 MED ORDER — SODIUM CHLORIDE 0.9 % IV BOLUS (SEPSIS)
1000.0000 mL | Freq: Once | INTRAVENOUS | Status: AC
Start: 2016-10-03 — End: 2016-10-03
  Administered 2016-10-03: 1000 mL via INTRAVENOUS

## 2016-10-03 MED ORDER — ONDANSETRON HCL 4 MG/2ML IJ SOLN: 4.0000 mg | Freq: Once | INTRAMUSCULAR | Status: DC | PRN

## 2016-10-03 MED ORDER — IOPAMIDOL (ISOVUE-300) INJECTION 61%
100.0000 mL | Freq: Once | INTRAVENOUS | Status: AC | PRN
  Administered 2016-10-03: 100 mL via INTRAVENOUS

## 2016-10-03 MED ORDER — MIDAZOLAM HCL 2 MG/2ML IJ SOLN
INTRAMUSCULAR | Status: DC | PRN
  Administered 2016-10-03: 1 mg via INTRAVENOUS

## 2016-10-03 MED ORDER — FENTANYL CITRATE (PF) 100 MCG/2ML IJ SOLN
INTRAMUSCULAR | Status: AC
  Filled 2016-10-03: qty 2

## 2016-10-03 MED ORDER — POTASSIUM CHLORIDE IN NACL 20-0.9 MEQ/L-% IV SOLN
INTRAVENOUS | Status: DC
  Administered 2016-10-03: 18:00:00 via INTRAVENOUS
  Filled 2016-10-03 (×2): qty 1000

## 2016-10-03 MED ORDER — SODIUM CHLORIDE 0.9 % IV BOLUS (SEPSIS)
1000.0000 mL | Freq: Once | INTRAVENOUS | Status: AC
  Administered 2016-10-03: 1000 mL via INTRAVENOUS

## 2016-10-03 MED ORDER — METHOCARBAMOL 500 MG PO TABS
500.0000 mg | ORAL_TABLET | Freq: Four times a day (QID) | ORAL | Status: DC | PRN
  Filled 2016-10-03: qty 1

## 2016-10-03 MED ORDER — ONDANSETRON 4 MG PO TBDP
8.0000 mg | ORAL_TABLET | Freq: Once | ORAL | Status: AC
  Administered 2016-10-03: 8 mg via ORAL
  Filled 2016-10-03: qty 2

## 2016-10-03 MED ORDER — FENTANYL CITRATE (PF) 100 MCG/2ML IJ SOLN
INTRAMUSCULAR | Status: DC | PRN
  Administered 2016-10-03: 50 ug via INTRAVENOUS

## 2016-10-03 MED ORDER — POLYETHYLENE GLYCOL 3350 17 G PO PACK: 17.0000 g | PACK | Freq: Every day | ORAL | Status: DC | PRN

## 2016-10-03 MED ORDER — IOTHALAMATE MEGLUMINE 43 % IV SOLN
INTRAVENOUS | Status: DC | PRN
  Administered 2016-10-03: 10 mL via URETHRAL

## 2016-10-03 MED ORDER — SODIUM CHLORIDE 0.9 % IV SOLN
10.0000 mL/h | Freq: Once | INTRAVENOUS | Status: AC
  Administered 2016-10-03: 22:00:00 via INTRAVENOUS

## 2016-10-03 MED ORDER — LIDOCAINE HCL (CARDIAC) 20 MG/ML IV SOLN
INTRAVENOUS | Status: DC | PRN
  Administered 2016-10-03: 30 mg via INTRAVENOUS

## 2016-10-03 MED ORDER — ONDANSETRON 4 MG PO TBDP
ORAL_TABLET | ORAL | Status: AC
  Administered 2016-10-03: 8 mg via ORAL
  Filled 2016-10-03: qty 1

## 2016-10-03 MED ORDER — MIDAZOLAM HCL 2 MG/2ML IJ SOLN
INTRAMUSCULAR | Status: AC
  Filled 2016-10-03: qty 2

## 2016-10-03 MED ORDER — PROPOFOL 10 MG/ML IV BOLUS
INTRAVENOUS | Status: DC | PRN
  Administered 2016-10-03: 100 mg via INTRAVENOUS

## 2016-10-03 MED ORDER — OXYCODONE HCL 5 MG PO TABS: 5.0000 mg | ORAL_TABLET | ORAL | Status: DC | PRN

## 2016-10-03 MED ORDER — SODIUM CHLORIDE 0.9% FLUSH
3.0000 mL | Freq: Two times a day (BID) | INTRAVENOUS | Status: DC
  Administered 2016-10-04: 3 mL via INTRAVENOUS

## 2016-10-03 MED ORDER — ACETAMINOPHEN 325 MG PO TABS
650.0000 mg | ORAL_TABLET | Freq: Four times a day (QID) | ORAL | Status: DC | PRN
  Administered 2016-10-04 – 2016-10-05 (×2): 650 mg via ORAL
  Filled 2016-10-03 (×2): qty 2

## 2016-10-03 MED ORDER — PIPERACILLIN-TAZOBACTAM 3.375 G IVPB 30 MIN
3.3750 g | Freq: Once | INTRAVENOUS | Status: AC
  Administered 2016-10-03: 3.375 g via INTRAVENOUS
  Filled 2016-10-03: qty 50

## 2016-10-03 MED ORDER — PHENYLEPHRINE HCL 10 MG/ML IJ SOLN
INTRAMUSCULAR | Status: DC | PRN
  Administered 2016-10-03: 100 ug via INTRAVENOUS

## 2016-10-03 MED ORDER — SODIUM CHLORIDE 0.9% FLUSH
3.0000 mL | INTRAVENOUS | Status: DC | PRN
Start: 2016-10-03 — End: 2016-10-04

## 2016-10-03 MED ORDER — ACETAMINOPHEN 650 MG RE SUPP: 650.0000 mg | Freq: Four times a day (QID) | RECTAL | Status: DC | PRN

## 2016-10-03 MED ORDER — ACETAMINOPHEN 325 MG PO TABS
ORAL_TABLET | ORAL | Status: AC
  Administered 2016-10-03: 650 mg via ORAL
  Filled 2016-10-03: qty 2

## 2016-10-03 MED ORDER — ONDANSETRON HCL 4 MG/2ML IJ SOLN
INTRAMUSCULAR | Status: DC | PRN
  Administered 2016-10-03: 4 mg via INTRAVENOUS

## 2016-10-03 MED ORDER — ALBUTEROL SULFATE (2.5 MG/3ML) 0.083% IN NEBU
2.5000 mg | INHALATION_SOLUTION | RESPIRATORY_TRACT | Status: DC | PRN
  Administered 2016-10-04 – 2016-10-05 (×2): 2.5 mg via RESPIRATORY_TRACT
  Filled 2016-10-03 (×2): qty 3

## 2016-10-03 MED ORDER — ONDANSETRON HCL 4 MG PO TABS: 4.0000 mg | ORAL_TABLET | Freq: Four times a day (QID) | ORAL | Status: DC | PRN

## 2016-10-03 MED ORDER — PIPERACILLIN-TAZOBACTAM 3.375 G IVPB: 3.3750 g | Freq: Three times a day (TID) | INTRAVENOUS | Status: DC

## 2016-10-03 MED ORDER — ONDANSETRON HCL 4 MG/2ML IJ SOLN
4.0000 mg | Freq: Four times a day (QID) | INTRAMUSCULAR | Status: DC | PRN
  Administered 2016-10-03 (×2): 4 mg via INTRAVENOUS
  Filled 2016-10-03 (×2): qty 2

## 2016-10-03 MED ORDER — VANCOMYCIN HCL IN DEXTROSE 1-5 GM/200ML-% IV SOLN: 1000.0000 mg | Freq: Two times a day (BID) | INTRAVENOUS | Status: DC

## 2016-10-03 SURGICAL SUPPLY — 19 items
BAG DRAIN CYSTO-URO LG1000N (MISCELLANEOUS) ×3 IMPLANT
CATH URETL 5X70 OPEN END (CATHETERS) ×3 IMPLANT
CONRAY 43 FOR UROLOGY 50M (MISCELLANEOUS) ×3 IMPLANT
GLOVE BIO SURGEON STRL SZ7 (GLOVE) ×3 IMPLANT
GLOVE BIO SURGEON STRL SZ8 (GLOVE) ×3 IMPLANT
GOWN STRL REUS W/ TWL LRG LVL3 (GOWN DISPOSABLE) ×1 IMPLANT
GOWN STRL REUS W/TWL LRG LVL3 (GOWN DISPOSABLE) ×2
GUIDEWIRE STR ZIPWIRE 035X150 (MISCELLANEOUS) ×3 IMPLANT
KIT RM TURNOVER CYSTO AR (KITS) ×3 IMPLANT
PACK CYSTO AR (MISCELLANEOUS) ×3 IMPLANT
PREP PVP WINGED SPONGE (MISCELLANEOUS) ×3 IMPLANT
SET CYSTO W/LG BORE CLAMP LF (SET/KITS/TRAYS/PACK) ×3 IMPLANT
SOL .9 NS 3000ML IRR  AL (IV SOLUTION) ×2
SOL .9 NS 3000ML IRR UROMATIC (IV SOLUTION) ×1 IMPLANT
STENT URET 6FRX24 CONTOUR (STENTS) ×3 IMPLANT
STENT URET 6FRX26 CONTOUR (STENTS) IMPLANT
SURGILUBE 2OZ TUBE FLIPTOP (MISCELLANEOUS) ×3 IMPLANT
SYRINGE IRR TOOMEY STRL 70CC (SYRINGE) ×3 IMPLANT
WATER STERILE IRR 1000ML POUR (IV SOLUTION) ×3 IMPLANT

## 2016-10-03 NOTE — Progress Notes (Signed)
Gave patient a CHG bath

## 2016-10-03 NOTE — Progress Notes (Signed)
Chaplain received an OR for prayer. CH met with the Pt. Pt's husband was at the bedside. Pt stated she was in pain but was hopeful she will get better. Pt requested for prayers to cope with her sickness. CH offered the Pt and her husband prayers and graceful presence.    10/03/16 1800  Clinical Encounter Type  Visited With Patient;Family  Visit Type Initial;Spiritual support  Referral From Nurse  Consult/Referral To Chaplain  Spiritual Encounters  Spiritual Needs Prayer

## 2016-10-03 NOTE — Progress Notes (Signed)
Pharmacy Antibiotic Note  Tiffany Mcclain is a 71 y.o. female admitted on 10/03/2016 with sepsis.  Pharmacy has been consulted for vancomycin and Zosyn dosing. Patient received vancomycin and Zosyn in ED.   Plan: Will continue Zosyn EI 3.375g IV Q8hr.    Will continue vancomycin 1g IV Q12hr for goal trough of 15-20. Will obtain trough prior to noon dose on 5/3.    Height: 5\' 4"  (162.6 cm) Weight: 195 lb 8.8 oz (88.7 kg) IBW/kg (Calculated) : 54.7  Temp (24hrs), Avg:98.9 F (37.2 C), Min:98.5 F (36.9 C), Max:99.3 F (37.4 C)   Recent Labs Lab 10/03/16 0617  WBC 9.2  CREATININE 1.01*  LATICACIDVEN 6.0*    Estimated Creatinine Clearance: 55.9 mL/min (A) (by C-G formula based on SCr of 1.01 mg/dL (H)).    Allergies  Allergen Reactions  . Codeine Nausea Only    "clammy feeling"    Antimicrobials this admission: Zosyn 5/1 >>  Vancomycin 5/1 >>   Dose adjustments this admission: N/A  Microbiology results: 5/1 BCx: pending  5/1 UCx: pending   Pharmacy will continue to monitor and adjust per consult.   Simpson,Michael L 10/03/2016 9:44 AM

## 2016-10-03 NOTE — Anesthesia Preprocedure Evaluation (Addendum)
Anesthesia Evaluation  Patient identified by MRN, date of birth, ID band Patient awake    Reviewed: Allergy & Precautions, NPO status , Patient's Chart, lab work & pertinent test results, reviewed documented beta blocker date and time   History of Anesthesia Complications (+) Family history of anesthesia reaction  Airway Mallampati: III  TM Distance: >3 FB     Dental  (+) Chipped   Pulmonary shortness of breath, former smoker,           Cardiovascular      Neuro/Psych  Headaches, PSYCHIATRIC DISORDERS Anxiety Depression    GI/Hepatic   Endo/Other    Renal/GU      Musculoskeletal  (+) Arthritis ,   Abdominal   Peds  Hematology  (+) anemia ,   Anesthesia Other Findings Does not take xarelto. Severe anemia, received 2 units. Hb now 8.3. No anesthetic complicacations. EKG reviewed.  Reproductive/Obstetrics                            Anesthesia Physical Anesthesia Plan  ASA: III  Anesthesia Plan: General   Post-op Pain Management:    Induction: Intravenous  Airway Management Planned: Oral ETT and LMA  Additional Equipment:   Intra-op Plan:   Post-operative Plan:   Informed Consent: I have reviewed the patients History and Physical, chart, labs and discussed the procedure including the risks, benefits and alternatives for the proposed anesthesia with the patient or authorized representative who has indicated his/her understanding and acceptance.     Plan Discussed with: CRNA  Anesthesia Plan Comments:         Anesthesia Quick Evaluation

## 2016-10-03 NOTE — Anesthesia Procedure Notes (Signed)
Procedure Name: LMA Insertion Date/Time: 10/03/2016 9:46 PM Performed by: Lendon Colonel Pre-anesthesia Checklist: Patient identified, Patient being monitored, Timeout performed, Emergency Drugs available and Suction available Patient Re-evaluated:Patient Re-evaluated prior to inductionOxygen Delivery Method: Circle system utilized Preoxygenation: Pre-oxygenation with 100% oxygen Intubation Type: IV induction Ventilation: Mask ventilation without difficulty LMA: LMA inserted Tube type: Oral Number of attempts: 1 Placement Confirmation: positive ETCO2 and breath sounds checked- equal and bilateral Tube secured with: Tape Dental Injury: Teeth and Oropharynx as per pre-operative assessment

## 2016-10-03 NOTE — Transfer of Care (Signed)
Immediate Anesthesia Transfer of Care Note  Patient: Tiffany Mcclain  Procedure(s) Performed: Procedure(s): CYSTOSCOPY WITH STENT PLACEMENT (Right)  Patient Location: PACU  Anesthesia Type:General  Level of Consciousness: awake, alert , oriented and patient cooperative  Airway & Oxygen Therapy: Patient Spontanous Breathing  Post-op Assessment: Report given to RN and Post -op Vital signs reviewed and stable  Post vital signs: Reviewed and stable  Last Vitals:  Vitals:   10/03/16 1543 10/03/16 1803  BP: 130/67 136/72  Pulse: 83 86  Resp: 18 20  Temp: 37.6 C 37.7 C    Last Pain:  Vitals:   10/03/16 1953  TempSrc:   PainSc: 8       Patients Stated Pain Goal: 3 (86/76/72 0947)  Complications: No apparent anesthesia complications

## 2016-10-03 NOTE — ED Provider Notes (Signed)
[facility] Emergency Department Provider Note  ____________________________________________  Time seen: Approximately 6:34 AM  I have reviewed the triage vital signs and the nursing notes.   HISTORY  Chief Complaint Fever and Chills    HPI Tiffany Mcclain is a 71 y.o. female who complains of worsening right lower quadrant abdominal pain over the past 24 hours. Gradual onset, constant, waxing and waning. Radiates to the right flank. Associated with fever to 101 and fast heartbeat. No dizziness or syncope. No dysuria frequency urgency. Vomited once tonight. No constipation or diarrhea. Never had anything like this before. No abdominal surgeries other than hysterectomy. Rates pain as 5 out of 10 currently. No aggravating or alleviating factors     Past Medical History:  Diagnosis Date  . Anxiety   . Arthritis   . Clubbed foot    left  . Depression   . Family history of adverse reaction to anesthesia    father combative/AMS after anethesia   . Fracture of right humerus    hx of   . Headache    hx of migraines   . Shortness of breath dyspnea    climbing stairs     Patient Active Problem List   Diagnosis Date Noted  . OA (osteoarthritis) of hip 05/04/2014     Past Surgical History:  Procedure Laterality Date  . ABDOMINAL HYSTERECTOMY    . JOINT REPLACEMENT     Right hip  . 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 Authorizing Provider  acetaminophen (TYLENOL ARTHRITIS PAIN) 650 MG CR tablet Take 650 mg by mouth every 8 (eight) hours as needed for pain.    Historical Provider, MD  ALPRAZolam Duanne Moron) 0.25 MG tablet Take 0.25 mg by mouth 2 (two) times daily as needed for anxiety or sleep.    Historical Provider,  MD  diphenhydrAMINE (SOMINEX) 25 MG tablet Take 25 mg by mouth 2 (two) times daily.    Historical Provider, MD  docusate sodium (COLACE) 100 MG capsule Take 100 mg by mouth every morning.    Historical Provider, MD  escitalopram (LEXAPRO) 20 MG tablet Take 20 mg by mouth every morning.    Historical Provider, MD  methocarbamol (ROBAXIN) 500 MG tablet Take 1 tablet (500 mg total) by mouth every 6 (six) hours as needed for muscle spasms. 05/05/14   Alexzandrew L Perkins, PA-C  oxyCODONE (OXY IR/ROXICODONE) 5 MG immediate release tablet Take 1-2 tablets (5-10 mg total) by mouth every 3 (three) hours as needed for moderate pain, severe pain or breakthrough pain. 05/05/14   Alexzandrew L Perkins, PA-C  rivaroxaban (XARELTO) 10 MG TABS tablet Take 1 tablet (10 mg total) by mouth daily with breakfast. Take Xarelto for two and a half more weeks, then discontinue Xarelto. Once the patient has completed the blood thinner regimen, then take a Baby 81 mg Aspirin daily for three more weeks. 05/05/14   Alexzandrew L Perkins, PA-C     Allergies Codeine   No family history on file.  Social History Social History  Substance Use Topics  . Smoking status: Former Smoker    Packs/day: 0.50    Types: Cigarettes    Quit date: 02/25/2014  . Smokeless tobacco: Never Used  . Alcohol use 1.2 oz/week  2 Glasses of wine per week     Comment: nightly     Review of Systems  Constitutional:  Positive fever and chills ENT:   No sore throat. No rhinorrhea. Lymphatic: No swollen glands, No extremity swelling Endocrine: No hot/cold flashes. No significant weight change. No neck swelling. Cardiovascular:   No chest pain or syncope. Respiratory:   No dyspnea or cough. Gastrointestinal:   Positive right lower quadrant abdominal pain as above with vomiting. No diarrhea..  Genitourinary:   Negative for dysuria or difficulty urinating. Musculoskeletal:   Negative for focal pain or swelling Neurological:   Negative for  headaches or weakness. All other systems reviewed and are negative except as documented above in ROS and HPI.  ____________________________________________   PHYSICAL EXAM:  VITAL SIGNS: ED Triage Vitals  Enc Vitals Group     BP 10/03/16 0608 (!) 156/68     Pulse Rate 10/03/16 0608 (!) 118     Resp 10/03/16 0608 (!) 22     Temp 10/03/16 0608 99.3 F (37.4 C)     Temp Source 10/03/16 0608 Oral     SpO2 10/03/16 0608 97 %     Weight 10/03/16 0558 192 lb (87.1 kg)     Height 10/03/16 0558 5\' 4"  (1.626 m)     Head Circumference --      Peak Flow --      Pain Score 10/03/16 0557 5     Pain Loc --      Pain Edu? --      Excl. in [place]? --     Vital signs reviewed, nursing assessments reviewed.   Constitutional:   Alert and oriented. Uncomfortable, not in distress. Eyes:   No scleral icterus. No conjunctival pallor. PERRL. EOMI.  No nystagmus. ENT   Head:   Normocephalic and atraumatic.   Nose:   No congestion/rhinnorhea. No septal hematoma   Mouth/Throat:   Dry mucous membranes, no pharyngeal erythema. No peritonsillar mass.    Neck:   No stridor. No SubQ emphysema. No meningismus. Hematological/Lymphatic/Immunilogical:   No cervical lymphadenopathy. Cardiovascular:   Tachycardia heart rate 120. Symmetric bilateral radial and DP pulses.  No murmurs.  Respiratory:   Normal respiratory effort without tachypnea nor retractions. Breath sounds are clear and equal bilaterally. No wheezes/rales/rhonchi. Gastrointestinal:   Soft with right lower quadrant tenderness. Non distended. There is no CVA tenderness.  No rebound, rigidity, or guarding. Rectal exam performed with nurse Donnetta Simpers at bedside. Brown stool, Hemoccult negative. Genitourinary:   deferred Musculoskeletal:   Normal range of motion in all extremities. No joint effusions.  No lower extremity tenderness.  No edema. Neurologic:   Normal speech and language.  CN 2-10 normal. Motor grossly intact. No gross focal  neurologic deficits are appreciated.  Skin:    Skin is warm, dry and intact. No rash noted.  No petechiae, purpura, or bullae.  ____________________________________________    LABS (pertinent positives/negatives) (all labs ordered are listed, but only abnormal results are displayed) Labs Reviewed  LACTIC ACID, PLASMA - Abnormal; Notable for the following:       Result Value   Lactic Acid, Venous 6.0 (*)    All other components within normal limits  CBC WITH DIFFERENTIAL/PLATELET - Abnormal; Notable for the following:    RBC 2.88 (*)    Hemoglobin 6.2 (*)    HCT 20.7 (*)    MCV 71.9 (*)    MCH 21.5 (*)    MCHC 29.9 (*)    RDW 18.9 (*)  Neutro Abs 8.6 (*)    Lymphs Abs 0.3 (*)    All other components within normal limits  PROTIME-INR - Abnormal; Notable for the following:    Prothrombin Time 15.8 (*)    All other components within normal limits  URINALYSIS, COMPLETE (UACMP) WITH MICROSCOPIC - Abnormal; Notable for the following:    Color, Urine YELLOW (*)    APPearance HAZY (*)    Glucose, UA >=500 (*)    Ketones, ur 5 (*)    Protein, ur 100 (*)    Squamous Epithelial / LPF 0-5 (*)    All other components within normal limits  CULTURE, BLOOD (ROUTINE X 2)  CULTURE, BLOOD (ROUTINE X 2)  URINE CULTURE  APTT  LACTIC ACID, PLASMA  COMPREHENSIVE METABOLIC PANEL  LIPASE, BLOOD  TROPONIN I  TYPE AND SCREEN  PREPARE RBC (CROSSMATCH)   ____________________________________________   EKG  Interpreted by me Sinus tachycardia rate 1:15. Normal axis and intervals. Normal QRS ST segments and T waves.  ____________________________________________    RADIOLOGY  Dg Chest Port 1 View  Result Date: 10/03/2016 CLINICAL DATA:  Fever and chills for 1 day.  Nonproductive cough. EXAM: PORTABLE CHEST 1 VIEW COMPARISON:  None. FINDINGS: Probable hiatal hernia. Mild linear left lung base opacities may be atelectatic. Right lung is clear. Pulmonary vasculature is normal. No pleural  effusion. Normal hilar and mediastinal contours. IMPRESSION: Probable hiatal hernia. Mild atelectatic appearing left lung base opacities. Electronically Signed   By: Andreas Newport M.D.   On: 10/03/2016 06:50    ____________________________________________   PROCEDURES Procedures CRITICAL CARE Performed by: Joni Fears, Cookie Pore   Total critical care time: 35 minutes  Critical care time was exclusive of separately billable procedures and treating other patients.  Critical care was necessary to treat or prevent imminent or life-threatening deterioration.  Critical care was time spent personally by me on the following activities: development of treatment plan with patient and/or surrogate as well as nursing, discussions with consultants, evaluation of patient's response to treatment, examination of patient, obtaining history from patient or surrogate, ordering and performing treatments and interventions, ordering and review of laboratory studies, ordering and review of radiographic studies, pulse oximetry and re-evaluation of patient's condition.  ____________________________________________   INITIAL IMPRESSION / ASSESSMENT AND PLAN / ED COURSE  Pertinent labs & imaging results that were available during my care of the patient were reviewed by me and considered in my medical decision making (see chart for details).  Patient presents with her brother quadrant abdominal pain with fever and tachycardia. Also tachypnea. code sepsis initiated, we'll initiate fluid bolus and given empiric antibiotics of vancomycin and Zosyn given her multiple vital sign abnormalities in the setting of significant right lower quadrant pain concerning for appendicitis. Other differential items possibly include bowel obstruction or perforation, pyelonephritis, kidney stone, biliary disease. Low suspicion for vascular pathology such as dissection AAA or mesenteric ischemia.     Clinical Course as of Oct 04 735   Tue Oct 03, 2016  0656 Will arrange transfusion for severe anemia while awaiting workup.  No hypotension or large GI bleed, emergency release blood not indicated at this time.  Hemoglobin: (!) 6.2 [PS]  0735 D/w pt. Will transfuse due to evidence of shock with low Hb.  Lactic Acid, Venous: (!!) 6.0 [PS]    Clinical Course User Index [PS] Carrie Mew, MD     ____________________________________________   FINAL CLINICAL IMPRESSION(S) / ED DIAGNOSES  Final diagnoses:  Abdominal pain, RLQ  Fever, unspecified  fever cause  Severe sepsis [facility])      New Prescriptions   No medications on file     Portions of this note were generated with dragon dictation software. Dictation errors may occur despite best attempts at proofreading.    Carrie Mew, MD 10/03/16 [phone]

## 2016-10-03 NOTE — Anesthesia Post-op Follow-up Note (Cosign Needed)
Anesthesia QCDR form completed.        

## 2016-10-03 NOTE — ED Triage Notes (Signed)
Patient to ER for c/o fever and chills since night before last. Patient reports fever of at least 101.3 with nonproductive cough, right sided flank pain. Denies any productive cough. Denies any urinary symptoms (frequency, dysuria, urgency, retention, etc). Also reports headache (has h/o migraines, but states doesn't feel the same). Patient also c/o increased thirst, no history of diabetes reported.

## 2016-10-03 NOTE — ED Provider Notes (Signed)
This patient was signed out to me by Dr. Brenton Grills. 71 year old female who presented with sepsis criteria. After evaluation, she was found to be significantly anemic and transfusion was ordered. In addition, she has a lactic acid of 6.0 and immediate antibiotics were added. Her urinalysis does not show infection. Her CT scan does not show appendicitis and there is a possible right ureteral stone although this is limited due to artifact from an artificial hip. Her creatinine is elevated today. At this time, the patient remains hemodynamically stable and I will admit her to the hospitalists for further evaluation and treatment.   Eula Listen, MD 10/03/16 [phone]

## 2016-10-03 NOTE — H&P (Signed)
[facility] NAME: Tiffany Mcclain    MR#:  [id number]  DATE OF BIRTH:  June 29, 1945  DATE OF ADMISSION:  10/03/2016  PRIMARY CARE PHYSICIAN: Albina Billet, MD   REQUESTING/REFERRING PHYSICIAN: Dr. Mariea Clonts  CHIEF COMPLAINT:   Chief Complaint  Patient presents with  . Fever  . Chills    HISTORY OF PRESENT ILLNESS:  Tiffany Mcclain  is a 71 y.o. female with a known history of Depression, anxiety and arthritis presents to the hospital complaining of right lower quadrant and flank pain along with dysuria and fever. She had 101.6 temperature at home. 99.6 on arrival to emergency room along with significant tachycardia. Here patient has not had vomiting. CT scan of the abdomen and pelvis showed some mild right hydroureteronephrosis with possible 1 mm right ureteral stone. She did have a right hip replacement limiting radiology read on the stone.  PAST MEDICAL HISTORY:   Past Medical History:  Diagnosis Date  . Anxiety   . Arthritis   . Clubbed foot    left  . Depression   . Family history of adverse reaction to anesthesia    father combative/AMS after anethesia   . Fracture of right humerus    hx of   . Headache    hx of migraines   . Shortness of breath dyspnea    climbing stairs    PAST SURGICAL HISTORY:   Past Surgical History:  Procedure Laterality Date  . ABDOMINAL HYSTERECTOMY    . JOINT REPLACEMENT     Right hip  . 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 Smoking status: Former Smoker    Packs/day: 0.50    Types: Cigarettes    Quit date: 02/25/2014  . Smokeless tobacco: Never Used  . Alcohol use 1.2 oz/week    2 Glasses of wine per week     Comment: nightly     FAMILY HISTORY:   Family  History  Problem Relation Age of Onset  . CAD Neg Hx     DRUG ALLERGIES:   Allergies  Allergen Reactions  . Codeine Nausea Only    "clammy feeling"    REVIEW OF SYSTEMS:   Review of Systems  Constitutional: Positive for malaise/fatigue. Negative for chills, fever and weight loss.  HENT: Negative for hearing loss and nosebleeds.   Eyes: Negative for blurred vision, double vision and pain.  Respiratory: Negative for cough, hemoptysis, sputum production, shortness of breath and wheezing.   Cardiovascular: Negative for chest pain, palpitations, orthopnea and leg swelling.  Gastrointestinal: Positive for abdominal pain. Negative for constipation, diarrhea, nausea and vomiting.  Genitourinary: Positive for dysuria. Negative for hematuria.  Musculoskeletal: Negative for back pain, falls and myalgias.  Skin: Negative for rash.  Neurological: Positive for weakness. Negative for dizziness, tremors, sensory change, speech change, focal weakness, seizures and headaches.  Endo/Heme/Allergies: Does not bruise/bleed easily.  Psychiatric/Behavioral: Negative for depression and memory loss. The patient is not nervous/anxious.     MEDICATIONS AT HOME:   Prior to Admission medications   Medication Sig Start Date End Date Taking? Authorizing Provider  acetaminophen (TYLENOL ARTHRITIS PAIN) 650 MG CR tablet Take 650 mg by mouth every 8 (eight) hours as needed for pain.   Yes Historical Provider, MD  diphenhydrAMINE (SOMINEX) 25 MG tablet Take 25 mg by mouth 2 (two) times daily.   Yes Historical Provider, MD  docusate sodium (COLACE) 100 MG capsule Take 100 mg by mouth every morning.   Yes Historical Provider, MD  escitalopram (LEXAPRO) 20 MG tablet Take 20 mg by mouth every morning.   Yes Historical Provider, MD  Multiple Vitamins-Minerals (PRESERVISION AREDS 2 PO) Take 1 tablet by mouth 2 (two) times daily.   Yes Historical Provider, MD  methocarbamol (ROBAXIN) 500 MG tablet Take 1 tablet (500 mg  total) by mouth every 6 (six) hours as needed for muscle spasms. Patient not taking: Reported on 10/03/2016 05/05/14   Alexzandrew L Perkins, PA-C  oxyCODONE (OXY IR/ROXICODONE) 5 MG immediate release tablet Take 1-2 tablets (5-10 mg total) by mouth every 3 (three) hours as needed for moderate pain, severe pain or breakthrough pain. Patient not taking: Reported on 10/03/2016 05/05/14   Alexzandrew L Dara Lords, PA-C  rivaroxaban (XARELTO) 10 MG TABS tablet Take 1 tablet (10 mg total) by mouth daily with breakfast. Take Xarelto for two and a half more weeks, then discontinue Xarelto. Once the patient has completed the blood thinner regimen, then take a Baby 81 mg Aspirin daily for three more weeks. Patient not taking: Reported on 10/03/2016 05/05/14   Alexzandrew L Perkins, PA-C     VITAL SIGNS:  Blood pressure 134/63, pulse 84, temperature 98.7 F (37.1 C), temperature source Oral, resp. rate 14, height 5\' 4"  (1.626 m), weight 88.7 kg (195 lb 8.8 oz), SpO2 100 %.  PHYSICAL EXAMINATION:  Physical Exam  GENERAL:  71 y.o.-year-old patient lying in the bed with no acute distress.  EYES: Pupils equal, round, reactive to light and accommodation. No scleral icterus. Extraocular muscles intact.  HEENT: Head atraumatic, normocephalic. Oropharynx and nasopharynx clear. No oropharyngeal erythema, moist oral mucosa  NECK:  Supple, no jugular venous distention. No thyroid enlargement, no tenderness.  LUNGS: Normal breath sounds bilaterally, no wheezing, rales, rhonchi. No use of accessory muscles of respiration.  CARDIOVASCULAR: S1, S2 normal. No murmurs, rubs, or gallops.  ABDOMEN: Soft. Right flank and CVA tenderness. Bowel sounds normal EXTREMITIES: No pedal edema, cyanosis, or clubbing. + 2 pedal & radial pulses b/l.   NEUROLOGIC: Cranial nerves II through XII are intact. No focal Motor or sensory deficits appreciated b/l PSYCHIATRIC: The patient is alert and oriented x 3. Good affect.  SKIN: No obvious rash,  lesion, or ulcer.   LABORATORY PANEL:   CBC  Recent Labs Lab 10/03/16 0617 10/03/16 0952  WBC 9.2  --   HGB 6.2* 5.5*  HCT 20.7* 18.8*  PLT 261  --    ------------------------------------------------------------------------------------------------------------------  Chemistries   Recent Labs Lab 10/03/16 0617  NA 135  K 3.0*  CL 105  CO2 18*  GLUCOSE 197*  BUN 11  CREATININE 1.01*  CALCIUM 8.5*  AST 49*  ALT 1,282*  ALKPHOS 87  BILITOT 1.0   ------------------------------------------------------------------------------------------------------------------  Cardiac Enzymes  Recent Labs Lab 10/03/16 0617  TROPONINI <0.03   ------------------------------------------------------------------------------------------------------------------  RADIOLOGY:  Ct Abdomen Pelvis W Contrast  Result Date: 10/03/2016 CLINICAL DATA:  Right lower quadrant for the past 24 hours. EXAM: CT ABDOMEN AND PELVIS WITH CONTRAST TECHNIQUE: Multidetector CT imaging of the abdomen and pelvis was performed using the standard protocol following bolus administration of intravenous contrast. CONTRAST:  121mL ISOVUE-300 IOPAMIDOL (ISOVUE-300) INJECTION 61% COMPARISON:  None. FINDINGS: Lower chest: No acute abnormality. Hepatobiliary: No focal liver abnormality is seen. Cholelithiasis. Mildly distended gallbladder. Pancreas: Unremarkable.  No pancreatic ductal dilatation or surrounding inflammatory changes. Spleen: Normal in size without focal abnormality. Adrenals/Urinary Tract: Normal adrenal glands. Mild right hydroureteronephrosis with perinephric stranding. 1 mm calcification in the right lower pelvis which may reflect a distal ureteral calculus, but evaluation is limited secondary to beam hardening artifact resulting from the right hip arthroplasty. Bladder is unremarkable. Stomach/Bowel: Stomach is within normal limits. No evidence of bowel wall thickening, distention, or inflammatory changes.  Vascular/Lymphatic: Normal caliber abdominal aorta. No lymphadenopathy. Reproductive: Status post hysterectomy. No adnexal masses. Other: No abdominal wall hernia or abnormality. No abdominopelvic ascites. Musculoskeletal: No acute osseous abnormality. Right hip arthroplasty with beam hardening artifact partially obscuring the lower pelvis. IMPRESSION: 1. Mild right hydroureteronephrosis with perinephric stranding. 1 mm calcification in the right lower pelvis which may reflect a distal ureteral calculus, but evaluation is limited secondary to beam hardening artifact resulting from the right hip arthroplasty. Alternatively, pyelonephritis can have a similar appearance. Correlate with urinalysis. 2. Cholelithiasis. Electronically Signed   By: Kathreen Devoid   On: 10/03/2016 09:21   Dg Chest Port 1 View  Result Date: 10/03/2016 CLINICAL DATA:  Fever and chills for 1 day.  Nonproductive cough. EXAM: PORTABLE CHEST 1 VIEW COMPARISON:  None. FINDINGS: Probable hiatal hernia. Mild linear left lung base opacities may be atelectatic. Right lung is clear. Pulmonary vasculature is normal. No pleural effusion. Normal hilar and mediastinal contours. IMPRESSION: Probable hiatal hernia. Mild atelectatic appearing left lung base opacities. Electronically Signed   By: Andreas Newport M.D.   On: 10/03/2016 06:50   IMPRESSION AND PLAN:   * Right hydroureteronephrosis with 1 mm ureteral stone Associated UTI and sepsis Start IV ceftriaxone. IV fluid bolus for sepsis Discussed with Dr. Alyson Ingles of urology. May need intervention. Patient is kept nothing by mouth. Repeat lactic acid.  * Microcytic anemia. Transfuse 2 units packed RBC. Stool Hemoccult negative. Likely iron deficiency. Check iron studies and ferritin. Will need routine colonoscopy as outpatient.  * Hyperglycemia. Check HbA1c. Could have undiagnosed diabetes mellitus or just stress reaction from the infection.  * Elevated ALT. Unclear etiology. We'll check  a CK level. Also check a Tylenol level. Check INR.  * DVT prophylaxis with Lovenox Will need to start once we figure out if urology will need to do any interventions.  All the records are reviewed and case discussed with ED provider. Management plans discussed with the patient, family and they are in agreement.  CODE STATUS: FULL CODE  TOTAL TIME TAKING CARE OF THIS PATIENT: 40 minutes.   Hillary Bow R M.D on 10/03/2016 at 11:32 AM  Between 7am to 6pm - Pager - [phone]  After 6pm go to [email or web address] - password [email or web address] [place] [facility] [facility]  Office  [phone]  CC: Primary care physician; Albina Billet, MD  Note: This dictation was prepared with Dragon dictation along with smaller phrase technology. Any transcriptional errors that result from this process are unintentional.

## 2016-10-03 NOTE — Consult Note (Signed)
Reason for Consult: Right Hydronephrosis / Possible Small Ureteral Stone, Suspect Pyelonephritis  Referring Physician: Hillary Bow MD  Tiffany Mcclain is an 71 y.o. female.   HPI:   1 - Right Hydronephrosis / Possible Small Ureteral Stone - mild-moderate hydro to level of distal ureter on ER CT 10/03/16. Questionable small (<65m) distal stone, though ipsilateral hip artroplasty makes interpretation difficult. No renal stones. No prior colic.   2 - Suspect Pyelonephritis - low grade fevers, lactic acidosis to 6 and some perinephric stranding on CT concerning for possible pyleonephritis. BCX, UCX 5/1 obtained and pending. Placed on empiric Rocephin. UA w/o bacteruria or nitrites.  She reports acute malaise and fevers to 102 at home last night.   PMH sig for Rt total hip, benign hyst.   Today " Tiffany Mcclain " (pronounce All-een) is seen in consultation for above. She is referred by Dr. SDarvin Neighbours She is also noted to be severely anemic and with significant transaminitis.   Past Medical History:  Diagnosis Date  . Anxiety   . Arthritis   . Clubbed foot    left  . Depression   . Family history of adverse reaction to anesthesia    father combative/AMS after anethesia   . Fracture of right humerus    hx of   . Headache    hx of migraines   . Shortness of breath dyspnea    climbing stairs    Past Surgical History:  Procedure Laterality Date  . ABDOMINAL HYSTERECTOMY    . JOINT REPLACEMENT     Right hip  . 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   Procedure: RIGHT TOTAL HIP ARTHROPLASTY ANTERIOR APPROACH;  Surgeon: FGearlean Alf MD;  Location: [facility];  Service: Orthopedics;  Laterality: Right;    Family History  Problem Relation Age of Onset  . CAD Neg Hx     Social History:  reports that she quit smoking about 2 years ago. Her smoking use included Cigarettes. She smoked 0.50 packs per day. She has never used smokeless tobacco. She reports that she drinks about 1.2 oz of alcohol per  week . She reports that she does not use drugs.  Allergies:  Allergies  Allergen Reactions  . Codeine Nausea Only    "clammy feeling"    Medications: I have reviewed the patient's current medications.  Results for orders placed or performed during the hospital encounter of 10/03/16 (from the past 48 hour(s))  Lactic acid, plasma     Status: Abnormal   Collection Time: 10/03/16  6:17 AM  Result Value Ref Range   Lactic Acid, Venous 6.0 (HH) 0.5 - 1.9 mmol/L    Comment: CRITICAL RESULT CALLED TO, READ BACK BY AND VERIFIED WITH KAILEY WALKER_0  ON 10/03/16 BY HKP   Comprehensive metabolic panel     Status: Abnormal   Collection Time: 10/03/16  6:17 AM  Result Value Ref Range   Sodium 135 135 - 145 mmol/L   Potassium 3.0 (L) 3.5 - 5.1 mmol/L   Chloride 105 101 - 111 mmol/L   CO2 18 (L) 22 - 32 mmol/L   Glucose, Bld 197 (H) 65 - 99 mg/dL   BUN 11 6 - 20 mg/dL   Creatinine, Ser 1.01 (H) 0.44 - 1.00 mg/dL   Calcium 8.5 (L) 8.9 - 10.3 mg/dL   Total Protein 6.5 6.5 - 8.1 g/dL   Albumin 3.3 (L) 3.5 - 5.0 g/dL   AST 49 (H) 15 - 41 U/L   ALT 1,282 (H) 14 -  54 U/L    Comment: RESULT CONFIRMED BY MANUAL DILUTION/HKP   Alkaline Phosphatase 87 38 - 126 U/L   Total Bilirubin 1.0 0.3 - 1.2 mg/dL   GFR calc non Af Amer 55 (L) >60 mL/min   GFR calc Af Amer >60 >60 mL/min    Comment: (NOTE) The eGFR has been calculated using the CKD EPI equation. This calculation has not been validated in all clinical situations. eGFR's persistently <60 mL/min signify possible Chronic Kidney Disease.    Anion gap 12 5 - 15  Lipase, blood     Status: None   Collection Time: 10/03/16  6:17 AM  Result Value Ref Range   Lipase 21 11 - 51 U/L  Troponin I     Status: None   Collection Time: 10/03/16  6:17 AM  Result Value Ref Range   Troponin I <0.03 <0.03 ng/mL  CBC WITH DIFFERENTIAL     Status: Abnormal   Collection Time: 10/03/16  6:17 AM  Result Value Ref Range   WBC 9.2 3.6 - 11.0 K/uL   RBC  2.88 (L) 3.80 - 5.20 MIL/uL   Hemoglobin 6.2 (L) 12.0 - 16.0 g/dL   HCT 20.7 (L) 35.0 - 47.0 %   MCV 71.9 (L) 80.0 - 100.0 fL   MCH 21.5 (L) 26.0 - 34.0 pg   MCHC 29.9 (L) 32.0 - 36.0 g/dL   RDW 18.9 (H) 11.5 - 14.5 %   Platelets 261 150 - 440 K/uL   Neutrophils Relative % 94 %   Neutro Abs 8.6 (H) 1.4 - 6.5 K/uL   Lymphocytes Relative 3 %   Lymphs Abs 0.3 (L) 1.0 - 3.6 K/uL   Monocytes Relative 3 %   Monocytes Absolute 0.3 0.2 - 0.9 K/uL   Eosinophils Relative 0 %   Eosinophils Absolute 0.0 0 - 0.7 K/uL   Basophils Relative 0 %   Basophils Absolute 0.0 0 - 0.1 K/uL  APTT     Status: None   Collection Time: 10/03/16  6:17 AM  Result Value Ref Range   aPTT 31 24 - 36 seconds  Protime-INR     Status: Abnormal   Collection Time: 10/03/16  6:17 AM  Result Value Ref Range   Prothrombin Time 15.8 (H) 11.4 - 15.2 seconds   INR 1.25   Urinalysis, Complete w Microscopic     Status: Abnormal   Collection Time: 10/03/16  6:17 AM  Result Value Ref Range   Color, Urine YELLOW (A) YELLOW   APPearance HAZY (A) CLEAR   Specific Gravity, Urine 1.015 1.005 - 1.030   pH 5.0 5.0 - 8.0   Glucose, UA >=500 (A) NEGATIVE mg/dL   Hgb urine dipstick NEGATIVE NEGATIVE   Bilirubin Urine NEGATIVE NEGATIVE   Ketones, ur 5 (A) NEGATIVE mg/dL   Protein, ur 100 (A) NEGATIVE mg/dL   Nitrite NEGATIVE NEGATIVE   Leukocytes, UA NEGATIVE NEGATIVE   RBC / HPF 0-5 0 - 5 RBC/hpf   WBC, UA 6-30 0 - 5 WBC/hpf   Bacteria, UA NONE SEEN NONE SEEN   Squamous Epithelial / LPF 0-5 (A) NONE SEEN   Mucous PRESENT    Amorphous Crystal PRESENT   ABO/Rh     Status: None   Collection Time: 10/03/16  6:17 AM  Result Value Ref Range   ABO/RH(D) A POS   Type and screen Lea     Status: None (Preliminary result)   Collection Time: 10/03/16  6:44 AM  Result Value  Ref Range   ABO/RH(D) A POS    Antibody Screen NEG    Sample Expiration 10/06/2016    Unit Number [id number]    Blood  Component Type RED CELLS,LR    Unit division 00    Status of Unit REL FROM [facility]    Transfusion Status OK TO TRANSFUSE    Crossmatch Result Compatible    Unit Number [id number]    Blood Component Type RED CELLS,LR    Unit division 00    Status of Unit REL FROM [facility]    Transfusion Status OK TO TRANSFUSE    Crossmatch Result Compatible    Unit Number [id number]    Blood Component Type RBC LR PHER2    Unit division 00    Status of Unit ALLOCATED    Transfusion Status OK TO TRANSFUSE    Crossmatch Result Compatible    Unit Number [id number]    Blood Component Type RED CELLS,LR    Unit division 00    Status of Unit ISSUED    Transfusion Status OK TO TRANSFUSE    Crossmatch Result Compatible   Prepare RBC     Status: None   Collection Time: 10/03/16  7:30 AM  Result Value Ref Range   Order Confirmation ORDER PROCESSED BY BLOOD BANK   Lactic acid, plasma     Status: Abnormal   Collection Time: 10/03/16  9:52 AM  Result Value Ref Range   Lactic Acid, Venous 2.1 (HH) 0.5 - 1.9 mmol/L    Comment: CRITICAL RESULT CALLED TO, READ BACK BY AND VERIFIED WITH LAURA CATES_0  ON 10/03/16 BY HKP   Procalcitonin - Baseline     Status: None   Collection Time: 10/03/16  9:52 AM  Result Value Ref Range   Procalcitonin 14.61 ng/mL    Comment:        Interpretation: PCT >= 10 ng/mL: Important systemic inflammatory response, almost exclusively due to severe bacterial sepsis or septic shock. (NOTE)         ICU PCT Algorithm               Non ICU PCT Algorithm    ----------------------------     ------------------------------         PCT < 0.25 ng/mL                 PCT < 0.1 ng/mL     Stopping of antibiotics            Stopping of antibiotics       strongly encouraged.               strongly encouraged.    ----------------------------     ------------------------------       PCT level decrease by               PCT < 0.25 ng/mL       >= 80% from peak PCT       OR PCT 0.25 - 0.5  ng/mL          Stopping of antibiotics                                             encouraged.     Stopping of antibiotics           encouraged.    ----------------------------     ------------------------------  PCT level decrease by              PCT >= 0.25 ng/mL       < 80% from peak PCT        AND PCT >= 0.5 ng/mL             Continuing antibiotics                                              encouraged.       Continuing antibiotics            encouraged.    ----------------------------     ------------------------------     PCT level increase compared          PCT > 0.5 ng/mL         with peak PCT AND          PCT >= 0.5 ng/mL             Escalation of antibiotics                                          strongly encouraged.      Escalation of antibiotics        strongly encouraged.   Hemoglobin and hematocrit, blood     Status: Abnormal   Collection Time: 10/03/16  9:52 AM  Result Value Ref Range   Hemoglobin 5.5 (L) 12.0 - 16.0 g/dL   HCT 18.8 (L) 35.0 - 47.0 %    Ct Abdomen Pelvis W Contrast  Result Date: 10/03/2016 CLINICAL DATA:  Right lower quadrant for the past 24 hours. EXAM: CT ABDOMEN AND PELVIS WITH CONTRAST TECHNIQUE: Multidetector CT imaging of the abdomen and pelvis was performed using the standard protocol following bolus administration of intravenous contrast. CONTRAST:  161m ISOVUE-300 IOPAMIDOL (ISOVUE-300) INJECTION 61% COMPARISON:  None. FINDINGS: Lower chest: No acute abnormality. Hepatobiliary: No focal liver abnormality is seen. Cholelithiasis. Mildly distended gallbladder. Pancreas: Unremarkable. No pancreatic ductal dilatation or surrounding inflammatory changes. Spleen: Normal in size without focal abnormality. Adrenals/Urinary Tract: Normal adrenal glands. Mild right hydroureteronephrosis with perinephric stranding. 1 mm calcification in the right lower pelvis which may reflect a distal ureteral calculus, but evaluation is limited secondary to beam  hardening artifact resulting from the right hip arthroplasty. Bladder is unremarkable. Stomach/Bowel: Stomach is within normal limits. No evidence of bowel wall thickening, distention, or inflammatory changes. Vascular/Lymphatic: Normal caliber abdominal aorta. No lymphadenopathy. Reproductive: Status post hysterectomy. No adnexal masses. Other: No abdominal wall hernia or abnormality. No abdominopelvic ascites. Musculoskeletal: No acute osseous abnormality. Right hip arthroplasty with beam hardening artifact partially obscuring the lower pelvis. IMPRESSION: 1. Mild right hydroureteronephrosis with perinephric stranding. 1 mm calcification in the right lower pelvis which may reflect a distal ureteral calculus, but evaluation is limited secondary to beam hardening artifact resulting from the right hip arthroplasty. Alternatively, pyelonephritis can have a similar appearance. Correlate with urinalysis. 2. Cholelithiasis. Electronically Signed   By: HKathreen Devoid  On: 10/03/2016 09:21   Dg Chest Port 1 View  Result Date: 10/03/2016 CLINICAL DATA:  Fever and chills for 1 day.  Nonproductive cough. EXAM: PORTABLE CHEST 1 VIEW COMPARISON:  None. FINDINGS: Probable hiatal hernia. Mild linear left lung base opacities may  be atelectatic. Right lung is clear. Pulmonary vasculature is normal. No pleural effusion. Normal hilar and mediastinal contours. IMPRESSION: Probable hiatal hernia. Mild atelectatic appearing left lung base opacities. Electronically Signed   By: Andreas Newport M.D.   On: 10/03/2016 06:50    Review of Systems  Constitutional: Positive for chills, fever and malaise/fatigue.  HENT: Negative.   Eyes: Negative.   Respiratory: Negative.   Cardiovascular: Negative.   Gastrointestinal: Negative.   Genitourinary: Positive for dysuria and urgency.  Musculoskeletal: Positive for myalgias.  Skin: Negative.    Blood pressure 134/63, pulse 84, temperature 98.7 F (37.1 C), temperature source Oral,  resp. rate 14, height _0  (1.626 m), weight 88.7 kg (195 lb 8.8 oz), SpO2 100 %. Physical Exam  Constitutional: She appears well-developed.  Husband at bedside  HENT:  Head: Normocephalic.  Eyes: Pupils are equal, round, and reactive to light.  Neck: Normal range of motion.  Cardiovascular: Normal rate.   Respiratory: Effort normal.  GI: Soft.  Genitourinary:  Genitourinary Comments: Mild Rt CVAT at present  Musculoskeletal: Normal range of motion.  Neurological: She is alert.  Skin: Skin is warm.  Psychiatric: She has a normal mood and affect.    Assessment/Plan:   1 - Right Hydronephrosis / Possible Small Ureteral Stone - DDX small ureteral stone v. Mild hydro from pyelo only v. Distal sricture from prior GYN surgery.   Discussed rec of cysto / Rt stent today with goal of renal decompression, then right ureteroscopy in 2-3 weeks in elective setting after clears infectious parameters. Risks, benefits, alternatives, expected peri-op course and need for staged approach discussed in detail.  2 - Suspect Pyelonephritis - agree with current ABX pending further CX data.   Anemia and transaminitis also quite concerning and likely unrelated to renal process. Agree with hospitalist admission and comangement as she will likely need additional evaluation of these medical confounders in house.    Sergey Ishler 10/03/2016, 12:22 PM

## 2016-10-03 NOTE — Op Note (Signed)
.  Preoperative diagnosis: right UPJ stone, sepsis  Postoperative diagnosis: Same  Procedure: 1 cystoscopy 2. right retrograde pyelography 3.  Intraoperative fluoroscopy, under one hour, with interpretation 4. right 6 x 26 JJ stent placement  Attending: Nicolette Bang  Anesthesia: General  Estimated blood loss: None  Drains: Right 6 x 26 JJ ureteral stent without tether, 16 French foley catheter  Specimens: none  Antibiotics: Vancomycin, zosyn  Findings: right mild hydronephrosis. No masses/lesions in the bladder. Ureteral orifices in normal anatomic location.  Indications: Patient is a 71 year old female with a history of probable right ureteral stone and concern for sepsis.  After discussing treatment options, they decided proceed with right stent placement.  Procedure her in detail: The patient was brought to the operating room and a brief timeout was done to ensure correct patient, correct procedure, correct site.  General anesthesia was administered patient was placed in dorsal lithotomy position.  Their genitalia was then prepped and draped in usual sterile fashion.  A rigid 40 French cystoscope was passed in the urethra and the bladder.  Bladder was inspected free masses or lesions.  the ureteral orifices were in the normal orthotopic locations.  a 6 french ureteral catheter was then instilled into the right ureteral orifice.  a gentle retrograde was obtained and findings noted above.  we then placed a zip wire through the ureteral catheter and advanced up to the renal pelvis.    We then placed a 6 x 26 double-j ureteral stent over the original zip wire.  We then removed the wire and good coil was noted in the the renal pelvis under fluoroscopy and the bladder under direct vision.  A foley catheter was then placed. the bladder was then drained and this concluded the procedure which was well tolerated by patient.  Complications: None  Condition: Stable, extubated, transferred to  PACU  Plan: Patient is to be admitted for IV antibiotics. He foley can be removed after she has been afebrile for 24 hours. She needs to be on culture specific antibiotics for 2 weeks prior to her stone procedure. She will have his stone extraction in 2 weeks.

## 2016-10-04 ENCOUNTER — Encounter: Payer: Self-pay | Admitting: Urology

## 2016-10-04 DIAGNOSIS — N201 Calculus of ureter: Secondary | ICD-10-CM

## 2016-10-04 DIAGNOSIS — Z79899 Other long term (current) drug therapy: Secondary | ICD-10-CM

## 2016-10-04 DIAGNOSIS — A419 Sepsis, unspecified organism: Secondary | ICD-10-CM

## 2016-10-04 DIAGNOSIS — M1990 Unspecified osteoarthritis, unspecified site: Secondary | ICD-10-CM

## 2016-10-04 DIAGNOSIS — Z87891 Personal history of nicotine dependence: Secondary | ICD-10-CM

## 2016-10-04 DIAGNOSIS — F419 Anxiety disorder, unspecified: Secondary | ICD-10-CM

## 2016-10-04 DIAGNOSIS — R231 Pallor: Secondary | ICD-10-CM

## 2016-10-04 DIAGNOSIS — D509 Iron deficiency anemia, unspecified: Secondary | ICD-10-CM

## 2016-10-04 DIAGNOSIS — N12 Tubulo-interstitial nephritis, not specified as acute or chronic: Secondary | ICD-10-CM

## 2016-10-04 DIAGNOSIS — Z792 Long term (current) use of antibiotics: Secondary | ICD-10-CM

## 2016-10-04 DIAGNOSIS — Z90710 Acquired absence of both cervix and uterus: Secondary | ICD-10-CM

## 2016-10-04 LAB — COMPREHENSIVE METABOLIC PANEL
ALK PHOS: 76 U/L (ref 38–126)
ALT: 14 U/L (ref 14–54)
AST: 16 U/L (ref 15–41)
Albumin: 2.9 g/dL — ABNORMAL LOW (ref 3.5–5.0)
Anion gap: 6 (ref 5–15)
BUN: 10 mg/dL (ref 6–20)
CALCIUM: 7.5 mg/dL — AB (ref 8.9–10.3)
CHLORIDE: 109 mmol/L (ref 101–111)
CO2: 21 mmol/L — AB (ref 22–32)
CREATININE: 0.65 mg/dL (ref 0.44–1.00)
GFR calc Af Amer: >60 mL/min (ref >60)
GFR calc non Af Amer: >60 mL/min (ref >60)
Glucose, Bld: 120 mg/dL — ABNORMAL HIGH (ref 65–99)
Potassium: 3.5 mmol/L (ref 3.5–5.1)
SODIUM: 136 mmol/L (ref 135–145)
Total Bilirubin: 0.7 mg/dL (ref 0.3–1.2)
Total Protein: 6.1 g/dL — ABNORMAL LOW (ref 6.5–8.1)

## 2016-10-04 LAB — CBC
HCT: 24.1 % — ABNORMAL LOW (ref 35.0–47.0)
HEMOGLOBIN: 7.4 g/dL — AB (ref 12.0–16.0)
MCH: 23.1 pg — AB (ref 26.0–34.0)
MCHC: 30.9 g/dL — ABNORMAL LOW (ref 32.0–36.0)
MCV: 74.7 fL — AB (ref 80.0–100.0)
Platelets: 203 10*3/uL (ref 150–440)
RBC: 3.23 MIL/uL — AB (ref 3.80–5.20)
RDW: 19 % — ABNORMAL HIGH (ref 11.5–14.5)
WBC: 11.1 10*3/uL — AB (ref 3.6–11.0)

## 2016-10-04 LAB — BPAM RBC
BLOOD PRODUCT EXPIRATION DATE: 201805042359
BLOOD PRODUCT EXPIRATION DATE: 201805072359
BLOOD PRODUCT EXPIRATION DATE: 201805242359
BLOOD PRODUCT EXPIRATION DATE: 201805242359
ISSUE DATE / TIME: 201805011016
ISSUE DATE / TIME: 201805011457
ISSUE DATE / TIME: 201805012311
UNIT TYPE AND RH: 5100
UNIT TYPE AND RH: 5100
UNIT TYPE AND RH: 6200
Unit Type and Rh: 6200

## 2016-10-04 LAB — TYPE AND SCREEN
ABO/RH(D): A POS
ANTIBODY SCREEN: NEGATIVE
UNIT DIVISION: 0
UNIT DIVISION: 0
UNIT DIVISION: 0
Unit division: 0

## 2016-10-04 LAB — HEMOGLOBIN A1C
HEMOGLOBIN A1C: 5.4 % (ref 4.8–5.6)
Mean Plasma Glucose: 108 mg/dL

## 2016-10-04 MED ORDER — IRON SUCROSE 20 MG/ML IV SOLN
200.0000 mg | INTRAVENOUS | Status: DC
  Administered 2016-10-04: 200 mg via INTRAVENOUS
  Filled 2016-10-04 (×3): qty 10

## 2016-10-04 MED ORDER — PANTOPRAZOLE SODIUM 40 MG PO TBEC
40.0000 mg | DELAYED_RELEASE_TABLET | Freq: Two times a day (BID) | ORAL | Status: DC
  Administered 2016-10-04 – 2016-10-06 (×3): 40 mg via ORAL
  Filled 2016-10-04 (×4): qty 1

## 2016-10-04 NOTE — Progress Notes (Signed)
No events overnight Pain resolved Patient feels better No fevers post op WBC 11 Culture pending  Vitals:   10/03/16 2355 10/04/16 0051 10/04/16 0618 10/04/16 0715  BP: (!) 149/73 134/70 (!) 162/75 (!) 155/75  Pulse: 87 84 80 77  Resp: 20  20 18   Temp: 98.1 F (36.7 C) 98.6 F (37 C) 98.2 F (36.8 C) 98.8 F (37.1 C)  TempSrc: Oral Oral Oral Oral  SpO2: 98% 98% 98% 97%  Weight:      Height:       I/O last 3 completed shifts: In: 5956 [I.V.:465; Blood:656; IV Piggyback:3200] Out: 500 [Urine:500] Total I/O In: -  Out: 400 [Urine:400]   NAD Soft NT ND Foley clear yellow  CBC    Component Value Date/Time   WBC 11.1 (H) 10/04/2016 0445   RBC 3.23 (L) 10/04/2016 0445   HGB 7.4 (L) 10/04/2016 0445   HCT 24.1 (L) 10/04/2016 0445   PLT 203 10/04/2016 0445   MCV 74.7 (L) 10/04/2016 0445   MCH 23.1 (L) 10/04/2016 0445   MCHC 30.9 (L) 10/04/2016 0445   RDW 19.0 (H) 10/04/2016 0445   LYMPHSABS 0.3 (L) 10/03/2016 0617   MONOABS 0.3 10/03/2016 0617   EOSABS 0.0 10/03/2016 0617   BASOSABS 0.0 10/03/2016 0617   BMP Latest Ref Rng & Units 10/04/2016 10/03/2016 05/06/2014  Glucose 65 - 99 mg/dL 120(H) 197(H) 123(H)  BUN 6 - 20 mg/dL 10 11 17   Creatinine 0.44 - 1.00 mg/dL 0.65 1.01(H) 0.59  Sodium 135 - 145 mmol/L 136 135 137  Potassium 3.5 - 5.1 mmol/L 3.5 3.0(L) 4.7  Chloride 101 - 111 mmol/L 109 105 101  CO2 22 - 32 mmol/L 21(L) 18(L) 25  Calcium 8.9 - 10.3 mg/dL 7.5(L) 8.5(L) 9.0   POD 1 cysto, right stent for sepsis/obstructing ureteral stone. Doing better today -continue abx pending c/s results -d/c foley -no further urologic intervention during this admission -will arrange outpatient surgery for definitive stone management in a few weeks once infection has been treated

## 2016-10-04 NOTE — Consult Note (Signed)
[facility] NOTE  Patient Care Team: Albina Billet, MD as PCP - General (Internal Medicine)  CHIEF COMPLAINTS/PURPOSE OF CONSULTATION: Severe anemia  HISTORY OF PRESENTING ILLNESS:  Tiffany Mcclain 71 y.o.  female no prior history of anemia is currently admitted to the hospital for pyelonephritis and possible kidney stone.  Had a CT scan of the abdomen and pelvis that showed right-sided pyelonephritis like changes in the kidney and the possible right distal ureteral stone. Patient has been evaluated by urology- had stent placement. She is also on antibiotics improving.  Patient noted to have severe anemia hemoglobin around 5.5. Patient status post 3 units of PRBC transfusion. Today hemoglobin is 7.4. On further review patient had chronic anemia hemoglobin around 10-11. Along with microcytosis. Patient instructed is done shows severe iron deficiency- ferritin 12 saturation 1%. Hematology has been consult for further recommendations and evaluation.  Patient denies any history of blood in stools or black stools. Denies any previous for colonoscopies. Denies any difficulty swallowing or pain with swallowing. Patient denies any vaginal bleeding. Denies any weight loss. In general appetite is good. Denies any blood in urine. Does admit to symptoms of pica-chewing ice.  ROS: A complete 10 point review of system is done which is negative except mentioned above in history of present illness  MEDICAL HISTORY:  Past Medical History:  Diagnosis Date  . Anxiety   . Arthritis   . Clubbed foot    left  . Depression   . Family history of adverse reaction to anesthesia    father combative/AMS after anethesia   . Fracture of right humerus    hx of   . Headache    hx of migraines   . Shortness of breath dyspnea    climbing stairs    SURGICAL HISTORY: Past Surgical History:  Procedure Laterality Date  . ABDOMINAL HYSTERECTOMY    . CYSTOSCOPY WITH STENT PLACEMENT Right  10/03/2016   Procedure: CYSTOSCOPY WITH STENT PLACEMENT;  Surgeon: Cleon Gustin, MD;  Location: [facility] ORS;  Service: Urology;  Laterality: Right;  . JOINT REPLACEMENT     Right hip  . 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   Procedure: RIGHT TOTAL HIP ARTHROPLASTY ANTERIOR APPROACH;  Surgeon: Gearlean Alf, MD;  Location: [facility];  Service: Orthopedics;  Laterality: Right;    SOCIAL HISTORY: Social History   Social History  . Marital status: Married    Spouse name: N/A  . Number of children: N/A  . Years of education: N/A   Occupational History  . Not on file.   Social History Main Topics  . Smoking status: Former Smoker    Packs/day: 0.50    Types: Cigarettes    Quit date: 02/25/2014  . Smokeless tobacco: Never Used  . Alcohol use 1.2 oz/week    2 Glasses of wine per week     Comment: nightly   . Drug use: No  . Sexual activity: Not on file   Other Topics Concern  . Not on file   Social History Narrative  . No narrative on file    FAMILY HISTORY: Family History  Problem Relation Age of Onset  . CAD Neg Hx     ALLERGIES:  is allergic to codeine.  MEDICATIONS:  Current Facility-Administered Medications  Medication Dose Route Frequency Provider Last Rate Last Dose  . acetaminophen (TYLENOL) tablet 650 mg  650 mg Oral Q6H PRN Hillary Bow, MD   650 mg at 10/04/16 1323   Or  .  acetaminophen (TYLENOL) suppository 650 mg  650 mg Rectal Q6H PRN Srikar Sudini, MD      . albuterol (PROVENTIL) (2.5 MG/3ML) 0.083% nebulizer solution 2.5 mg  2.5 mg Nebulization Q2H PRN Srikar Sudini, MD      . cefTRIAXone (ROCEPHIN) 1 g in dextrose 5 % 50 mL IVPB - Premix  1 g Intravenous Q24H Srikar Sudini, MD      . escitalopram (LEXAPRO) tablet 20 mg  20 mg Oral q morning - 10a Cleon Gustin, MD   20 mg at 10/04/16 1023  . iopamidol (ISOVUE-300) 61 % injection 30 mL  30 mL Oral Once PRN Carrie Mew, MD      . iron sucrose (VENOFER) 200 mg in sodium chloride 0.9 % 150 mL  IVPB  200 mg Intravenous Q24H Cammie Sickle, MD      . methocarbamol (ROBAXIN) tablet 500 mg  500 mg Oral Q6H PRN Cleon Gustin, MD      . morphine 2 MG/ML injection 2 mg  2 mg Intravenous Q4H PRN Cleon Gustin, MD   2 mg at 10/03/16 1953  . ondansetron (ZOFRAN) tablet 4 mg  4 mg Oral Q6H PRN Hillary Bow, MD       Or  . ondansetron (ZOFRAN) injection 4 mg  4 mg Intravenous Q6H PRN Hillary Bow, MD   4 mg at 10/03/16 2027  . oxyCODONE (Oxy IR/ROXICODONE) immediate release tablet 5-10 mg  5-10 mg Oral Q3H PRN Cleon Gustin, MD      . pantoprazole (PROTONIX) EC tablet 40 mg  40 mg Oral BID AC Fritzi Mandes, MD      . polyethylene glycol (MIRALAX / GLYCOLAX) packet 17 g  17 g Oral Daily PRN Srikar Sudini, MD          .  PHYSICAL EXAMINATION:  Vitals:   10/04/16 0715 10/04/16 1241  BP: (!) 155/75 (!) 170/82  Pulse: 77 86  Resp: 18 (!) 22  Temp: 98.8 F (37.1 C) 98.1 F (36.7 C)   Filed Weights   10/03/16 0558 10/03/16 0632 10/03/16 1300  Weight: 192 lb (87.1 kg) 195 lb 8.8 oz (88.7 kg) 198 lb 14.4 oz (90.2 kg)    GENERAL: Well-nourished well-developed; Alert, no distress and comfortable.   Accompanied by daughter and granddaughter. EYES: Positive for pallor no icterus. OROPHARYNX: no thrush or ulceration. NECK: supple, no masses felt LYMPH:  no palpable lymphadenopathy in the cervical, axillary or inguinal regions LUNGS: decreased breath sounds to auscultation at bases and  No wheeze or crackles HEART/CVS: regular rate & rhythm and no murmurs; No lower extremity edema ABDOMEN: abdomen soft, non-tender and normal bowel sounds Musculoskeletal:no cyanosis of digits and no clubbing  PSYCH: alert & oriented x 3 with fluent speech NEURO: no focal motor/sensory deficits SKIN:  no rashes or significant lesions  LABORATORY DATA:  I have reviewed the data as listed Lab Results  Component Value Date   WBC 11.1 (H) 10/04/2016   HGB 7.4 (L) 10/04/2016   HCT 24.1  (L) 10/04/2016   MCV 74.7 (L) 10/04/2016   PLT 203 10/04/2016    Recent Labs  10/03/16 0617 10/04/16 0445  NA 135 136  K 3.0* 3.5  CL 105 109  CO2 18* 21*  GLUCOSE 197* 120*  BUN 11 10  CREATININE 1.01* 0.65  CALCIUM 8.5* 7.5*  GFRNONAA 55* >60  GFRAA >60 >60  PROT 6.5 6.1*  ALBUMIN 3.3* 2.9*  AST 49* 16  ALT 1,282* 14  ALKPHOS 87 76  BILITOT 1.0 0.7    RADIOGRAPHIC STUDIES: I have personally reviewed the radiological images as listed and agreed with the findings in the report. Ct Abdomen Pelvis W Contrast  Result Date: 10/03/2016 CLINICAL DATA:  Right lower quadrant for the past 24 hours. EXAM: CT ABDOMEN AND PELVIS WITH CONTRAST TECHNIQUE: Multidetector CT imaging of the abdomen and pelvis was performed using the standard protocol following bolus administration of intravenous contrast. CONTRAST:  145mL ISOVUE-300 IOPAMIDOL (ISOVUE-300) INJECTION 61% COMPARISON:  None. FINDINGS: Lower chest: No acute abnormality. Hepatobiliary: No focal liver abnormality is seen. Cholelithiasis. Mildly distended gallbladder. Pancreas: Unremarkable. No pancreatic ductal dilatation or surrounding inflammatory changes. Spleen: Normal in size without focal abnormality. Adrenals/Urinary Tract: Normal adrenal glands. Mild right hydroureteronephrosis with perinephric stranding. 1 mm calcification in the right lower pelvis which may reflect a distal ureteral calculus, but evaluation is limited secondary to beam hardening artifact resulting from the right hip arthroplasty. Bladder is unremarkable. Stomach/Bowel: Stomach is within normal limits. No evidence of bowel wall thickening, distention, or inflammatory changes. Vascular/Lymphatic: Normal caliber abdominal aorta. No lymphadenopathy. Reproductive: Status post hysterectomy. No adnexal masses. Other: No abdominal wall hernia or abnormality. No abdominopelvic ascites. Musculoskeletal: No acute osseous abnormality. Right hip arthroplasty with beam hardening  artifact partially obscuring the lower pelvis. IMPRESSION: 1. Mild right hydroureteronephrosis with perinephric stranding. 1 mm calcification in the right lower pelvis which may reflect a distal ureteral calculus, but evaluation is limited secondary to beam hardening artifact resulting from the right hip arthroplasty. Alternatively, pyelonephritis can have a similar appearance. Correlate with urinalysis. 2. Cholelithiasis. Electronically Signed   By: Kathreen Devoid   On: 10/03/2016 09:21   Dg Chest Port 1 View  Result Date: 10/03/2016 CLINICAL DATA:  Fever and chills for 1 day.  Nonproductive cough. EXAM: PORTABLE CHEST 1 VIEW COMPARISON:  None. FINDINGS: Probable hiatal hernia. Mild linear left lung base opacities may be atelectatic. Right lung is clear. Pulmonary vasculature is normal. No pleural effusion. Normal hilar and mediastinal contours. IMPRESSION: Probable hiatal hernia. Mild atelectatic appearing left lung base opacities. Electronically Signed   By: Andreas Newport M.D.   On: 10/03/2016 06:50    ASSESSMENT & PLAN:   # 71 year old female patient currently admitted to the hospital for pyelonephritis- noted to have severe anemia  # Severe anemia hemoglobin 5.5 status post PRBC transfusion. Iron studies suggestive of iron deficiency. Recommend IV iron infusion daily 3. If patient discharged tomorrow- we'll could resume IV iron as an outpatient/the cancer [facility].  # The etiology of iron deficiency is unclear.- This appears chronic. Patient will definitely need GI workup-EGD colonoscopy. Possibly a capsule study- if above workup is negative. This was discussed the patient and her daughter in detail. They agree.  # Pyelonephritis/right ureteral calculus status post stenting- on antibiotics improving  # Thank you Dr. Posey Pronto for allowing me to participate in the care of your pleasant patient. Please do not hesitate to contact me with questions or concerns in the interim.  All questions were  answered. The patient knows to call the clinic with any problems, questions or concerns.   Cammie Sickle, MD 10/04/2016 6:11 PM

## 2016-10-04 NOTE — Progress Notes (Signed)
Chaplain made a follow up visit with Pt. Pt's husband was at the bedside. Pt stated she was feeling better compared to yesterday. Pt stated she had no pain today. However, Pt was concerned about a report from the Dc's diagnoses stating she was anaemic. Pt asked for prayers to cope with this new health challenge. CH offered prayers for Pt and her husband. [place] to make a follow up visit with this Pt as needed.    10/04/16 1400  Clinical Encounter Type  Visited With Patient;Family  Visit Type Follow-up;Spiritual support  Referral From [place]

## 2016-10-04 NOTE — Anesthesia Postprocedure Evaluation (Signed)
Anesthesia Post Note  Patient: Tiffany Mcclain  Procedure(s) Performed: Procedure(s) (LRB): CYSTOSCOPY WITH STENT PLACEMENT (Right)  Patient location during evaluation: PACU Anesthesia Type: General Level of consciousness: awake and alert Pain management: pain level controlled Vital Signs Assessment: post-procedure vital signs reviewed and stable Respiratory status: spontaneous breathing, nonlabored ventilation, respiratory function stable and patient connected to nasal cannula oxygen Cardiovascular status: blood pressure returned to baseline and stable Postop Assessment: no signs of nausea or vomiting Anesthetic complications: no     Last Vitals:  Vitals:   10/04/16 0051 10/04/16 0618  BP: 134/70 (!) 162/75  Pulse: 84 80  Resp:  20  Temp: 37 C 36.8 C    Last Pain:  Vitals:   10/04/16 0618  TempSrc: Oral  PainSc:                  [name]

## 2016-10-04 NOTE — Progress Notes (Signed)
[facility] [place] NAME: Tiffany Mcclain    MR#:  [id number]  DATE OF BIRTH:  04-03-1946  SUBJECTIVE:    REVIEW OF SYSTEMS:   ROS Tolerating Diet: Tolerating PT:   DRUG ALLERGIES:   Allergies  Allergen Reactions  . Codeine Nausea Only    "clammy feeling"    VITALS:  Blood pressure (!) 170/82, pulse 86, temperature 98.1 F (36.7 C), temperature source Oral, resp. rate (!) 22, height 5\' 4"  (1.626 m), weight 90.2 kg (198 lb 14.4 oz), SpO2 98 %.  PHYSICAL EXAMINATION:   Physical Exam  GENERAL:  71 y.o.-year-old patient lying in the bed with no acute distress.  EYES: Pupils equal, round, reactive to light and accommodation. No scleral icterus. Extraocular muscles intact.  HEENT: Head atraumatic, normocephalic. Oropharynx and nasopharynx clear.  NECK:  Supple, no jugular venous distention. No thyroid enlargement, no tenderness.  LUNGS: Normal breath sounds bilaterally, no wheezing, rales, rhonchi. No use of accessory muscles of respiration.  CARDIOVASCULAR: S1, S2 normal. No murmurs, rubs, or gallops.  ABDOMEN: Soft, nontender, nondistended. Bowel sounds present. No organomegaly or mass.  EXTREMITIES: No cyanosis, clubbing or edema b/l.    NEUROLOGIC: Cranial nerves II through XII are intact. No focal Motor or sensory deficits b/l.   PSYCHIATRIC:  patient is alert and oriented x 3.  SKIN: No obvious rash, lesion, or ulcer.   LABORATORY PANEL:  CBC  Recent Labs Lab 10/04/16 0445  WBC 11.1*  HGB 7.4*  HCT 24.1*  PLT 203    Chemistries   Recent Labs Lab 10/04/16 0445  NA 136  K 3.5  CL 109  CO2 21*  GLUCOSE 120*  BUN 10  CREATININE 0.65  CALCIUM 7.5*  AST 16  ALT 14  ALKPHOS 76  BILITOT 0.7   Cardiac Enzymes  Recent Labs Lab 10/03/16 0617  TROPONINI <0.03   RADIOLOGY:  Ct Abdomen Pelvis W Contrast  Result Date: 10/03/2016 CLINICAL DATA:  Right lower quadrant for the past 24 hours. EXAM: CT  ABDOMEN AND PELVIS WITH CONTRAST TECHNIQUE: Multidetector CT imaging of the abdomen and pelvis was performed using the standard protocol following bolus administration of intravenous contrast. CONTRAST:  129mL ISOVUE-300 IOPAMIDOL (ISOVUE-300) INJECTION 61% COMPARISON:  None. FINDINGS: Lower chest: No acute abnormality. Hepatobiliary: No focal liver abnormality is seen. Cholelithiasis. Mildly distended gallbladder. Pancreas: Unremarkable. No pancreatic ductal dilatation or surrounding inflammatory changes. Spleen: Normal in size without focal abnormality. Adrenals/Urinary Tract: Normal adrenal glands. Mild right hydroureteronephrosis with perinephric stranding. 1 mm calcification in the right lower pelvis which may reflect a distal ureteral calculus, but evaluation is limited secondary to beam hardening artifact resulting from the right hip arthroplasty. Bladder is unremarkable. Stomach/Bowel: Stomach is within normal limits. No evidence of bowel wall thickening, distention, or inflammatory changes. Vascular/Lymphatic: Normal caliber abdominal aorta. No lymphadenopathy. Reproductive: Status post hysterectomy. No adnexal masses. Other: No abdominal wall hernia or abnormality. No abdominopelvic ascites. Musculoskeletal: No acute osseous abnormality. Right hip arthroplasty with beam hardening artifact partially obscuring the lower pelvis. IMPRESSION: 1. Mild right hydroureteronephrosis with perinephric stranding. 1 mm calcification in the right lower pelvis which may reflect a distal ureteral calculus, but evaluation is limited secondary to beam hardening artifact resulting from the right hip arthroplasty. Alternatively, pyelonephritis can have a similar appearance. Correlate with urinalysis. 2. Cholelithiasis. Electronically Signed   By: Kathreen Devoid   On: 10/03/2016 09:21   Dg Chest Port 1 View  Result Date: 10/03/2016  CLINICAL DATA:  Fever and chills for 1 day.  Nonproductive cough. EXAM: PORTABLE CHEST 1 VIEW  COMPARISON:  None. FINDINGS: Probable hiatal hernia. Mild linear left lung base opacities may be atelectatic. Right lung is clear. Pulmonary vasculature is normal. No pleural effusion. Normal hilar and mediastinal contours. IMPRESSION: Probable hiatal hernia. Mild atelectatic appearing left lung base opacities. Electronically Signed   By: Andreas Newport M.D.   On: 10/03/2016 06:50   ASSESSMENT AND PLAN:  Tiffany Mcclain  is a 71 y.o. female with a known history of Depression, anxiety and arthritis presents to the hospital complaining of right lower quadrant and flank pain along with dysuria and fever. She had 101.6 temperature at home. 99.6 on arrival to emergency room along with significant tachycardia.  1. Acute right UPJ stone with obstruction s/p cystoscopy and stent placement -feels better -IV rocephin UC 80K E coli -no fevers  2. Chronic Iron deficiency anemia with low MCV and low serum iron stores -came in with hgb of 6.2--5.5-- 3 units BT---8.3 -pt has been using NSAIDS  Three times a day for neck and back pain. Denies any melena -GI consult for EGD and colonoscopy ( no coverage today) -Hematology consult for IV iron and out pt f/u -po PPI bid Hemoccult stools  3.elevated ALT ?unclear 1200---16 resolved  4.DVT prophylaxis SCD  Case discussed with Care Management/Social Worker. Management plans discussed with the patient, family and they are in agreement.  CODE STATUS: FUlll  DVT Prophylaxis: SCD  TOTAL TIME TAKING CARE OF THIS PATIENT: *30* minutes.  >50% time spent on counselling and coordination of care pt and husband  POSSIBLE D/C IN 1-2* DAYS, DEPENDING ON CLINICAL CONDITION.  Note: This dictation was prepared with Dragon dictation along with smaller phrase technology. Any transcriptional errors that result from this process are unintentional.  Braxson Hollingsworth M.D on 10/04/2016 at 7:11 PM  Between 7am to 6pm - Pager - [phone]  After 6pm go to [email or web address] -  password [email or web address]  Office  [phone]  CC: Primary care physician; Albina Billet, MD

## 2016-10-05 ENCOUNTER — Encounter: Payer: Self-pay | Admitting: Urology

## 2016-10-05 ENCOUNTER — Inpatient Hospital Stay: Payer: Medicare Other | Admitting: Anesthesiology

## 2016-10-05 ENCOUNTER — Encounter
Admission: EM | Disposition: A | Discharge status: Home / Self Care | Payer: Self-pay | Source: Home / Self Care | Attending: Internal Medicine

## 2016-10-05 DIAGNOSIS — D500 Iron deficiency anemia secondary to blood loss (chronic): Secondary | ICD-10-CM

## 2016-10-05 DIAGNOSIS — Z9889 Other specified postprocedural states: Secondary | ICD-10-CM

## 2016-10-05 DIAGNOSIS — K269 Duodenal ulcer, unspecified as acute or chronic, without hemorrhage or perforation: Secondary | ICD-10-CM

## 2016-10-05 DIAGNOSIS — D509 Iron deficiency anemia, unspecified: Secondary | ICD-10-CM

## 2016-10-05 DIAGNOSIS — R0609 Other forms of dyspnea: Secondary | ICD-10-CM

## 2016-10-05 HISTORY — PX: ESOPHAGOGASTRODUODENOSCOPY: SHX5428

## 2016-10-05 LAB — URINE CULTURE

## 2016-10-05 LAB — GLUCOSE, CAPILLARY: Glucose-Capillary: 100 mg/dL — ABNORMAL HIGH (ref 65–99)

## 2016-10-05 LAB — HEMOGLOBIN: HEMOGLOBIN: 7.8 g/dL — AB (ref 12.0–16.0)

## 2016-10-05 LAB — PROCALCITONIN: Procalcitonin: 8.3 ng/mL

## 2016-10-05 SURGERY — EGD (ESOPHAGOGASTRODUODENOSCOPY)
Anesthesia: General

## 2016-10-05 MED ORDER — PROPOFOL 10 MG/ML IV BOLUS
INTRAVENOUS | Status: DC | PRN
  Administered 2016-10-05: 30 mg via INTRAVENOUS
  Administered 2016-10-05: 50 mg via INTRAVENOUS

## 2016-10-05 MED ORDER — LIDOCAINE HCL 2 % IJ SOLN
INTRAMUSCULAR | Status: AC
  Filled 2016-10-05: qty 10

## 2016-10-05 MED ORDER — SULFAMETHOXAZOLE-TRIMETHOPRIM 800-160 MG PO TABS: 1.0000 | ORAL_TABLET | Freq: Two times a day (BID) | ORAL | 0 refills | Status: DC

## 2016-10-05 MED ORDER — PROPOFOL 10 MG/ML IV BOLUS
INTRAVENOUS | Status: AC
  Filled 2016-10-05: qty 20

## 2016-10-05 MED ORDER — FERROUS SULFATE 325 (65 FE) MG PO TABS: 325.0000 mg | ORAL_TABLET | Freq: Every day | ORAL | 0 refills | Status: DC

## 2016-10-05 MED ORDER — SULFAMETHOXAZOLE-TRIMETHOPRIM 800-160 MG PO TABS
1.0000 | ORAL_TABLET | Freq: Two times a day (BID) | ORAL | Status: DC
  Administered 2016-10-05 – 2016-10-06 (×3): 1 via ORAL
  Filled 2016-10-05 (×4): qty 1

## 2016-10-05 MED ORDER — PROPOFOL 500 MG/50ML IV EMUL
INTRAVENOUS | Status: AC
  Filled 2016-10-05: qty 50

## 2016-10-05 MED ORDER — LIDOCAINE HCL (CARDIAC) 20 MG/ML IV SOLN
INTRAVENOUS | Status: DC | PRN
  Administered 2016-10-05: 60 mg via INTRAVENOUS

## 2016-10-05 MED ORDER — SODIUM CHLORIDE 0.9 % IV SOLN
Freq: Once | INTRAVENOUS | Status: AC
  Administered 2016-10-05: 17:00:00 via INTRAVENOUS

## 2016-10-05 MED ORDER — FENTANYL CITRATE (PF) 100 MCG/2ML IJ SOLN: INTRAMUSCULAR | Status: DC | PRN

## 2016-10-05 MED ORDER — PROPOFOL 500 MG/50ML IV EMUL
INTRAVENOUS | Status: DC | PRN
  Administered 2016-10-05: 160 ug/kg/min via INTRAVENOUS

## 2016-10-05 NOTE — Anesthesia Post-op Follow-up Note (Cosign Needed)
Anesthesia QCDR form completed.        

## 2016-10-05 NOTE — Discharge Instructions (Signed)
Resume diet and activity as before ° ° °

## 2016-10-05 NOTE — Transfer of Care (Signed)
Immediate Anesthesia Transfer of Care Note  Patient: Tiffany Mcclain  Procedure(s) Performed: Procedure(s): ESOPHAGOGASTRODUODENOSCOPY (EGD) (N/A)  Patient Location: PACU  Anesthesia Type:General  Level of Consciousness: sedated  Airway & Oxygen Therapy: Patient Spontanous Breathing and Patient connected to nasal cannula oxygen  Post-op Assessment: Report given to RN and Post -op Vital signs reviewed and stable  Post vital signs: Reviewed and stable  Last Vitals:  Vitals:   10/05/16 1548 10/05/16 1700  BP: (!) 197/80 (!) (P) 152/74  Pulse: 81 (P) 77  Resp:  (P) 16  Temp: (!) 38.2 C (!) (P) 38.6 C    Last Pain:  Vitals:   10/05/16 1700  TempSrc: (P) Tympanic  PainSc:       Patients Stated Pain Goal: 3 (15/83/09 4076)  Complications: No apparent anesthesia complications

## 2016-10-05 NOTE — Op Note (Signed)
[facility] Gastroenterology Patient Name: Tiffany Mcclain Procedure Date: 10/05/2016 4:38 PM MRN: [id number] Account #: [id number] Date of Birth: 05/12/1946 Admit Type: Inpatient Age: 71 Room: [facility] ENDO ROOM 1 Gender: Female Note Status: Finalized Procedure:            Upper GI endoscopy Indications:          Iron deficiency anemia Providers:            Lucilla Lame MD, MD Referring MD:         Leona Carry. Hall Busing, MD (Referring MD) Medicines:            Propofol per Anesthesia Complications:        No immediate complications. Procedure:            Pre-Anesthesia Assessment:                       - Prior to the procedure, a History and Physical was                        performed, and patient medications and allergies were                        reviewed. The patient's tolerance of previous                        anesthesia was also reviewed. The risks and benefits of                        the procedure and the sedation options and risks were                        discussed with the patient. All questions were                        answered, and informed consent was obtained. Prior                        Anticoagulants: The patient has taken no previous                        anticoagulant or antiplatelet agents. ASA Grade                        Assessment: II - A patient with mild systemic disease.                        After reviewing the risks and benefits, the patient was                        deemed in satisfactory condition to undergo the                        procedure.                       After obtaining informed consent, the endoscope was                        passed under direct vision. Throughout the procedure,  the patient's blood pressure, pulse, and oxygen                        saturations were monitored continuously. The Endoscope                        was introduced through the mouth, and advanced to the           second part of duodenum. The upper GI endoscopy was                        accomplished without difficulty. The patient tolerated                        the procedure well. Findings:      A large hiatal hernia was present.      Moderate inflammation characterized by erosions was found in the gastric       antrum.      One non-bleeding cratered duodenal ulcer with no stigmata of bleeding       was found in the first portion of the duodenum. Impression:           - Large hiatal hernia.                       - Gastritis.                       - One non-bleeding duodenal ulcer with no stigmata of                        bleeding.                       - No specimens collected. Recommendation:       - Return patient to hospital ward for ongoing care. Procedure Code(s):    --- Professional ---                       [phone], Esophagogastroduodenoscopy, flexible, transoral;                        diagnostic, including collection of specimen(s) by                        brushing or washing, when performed (separate procedure) Diagnosis Code(s):    --- Professional ---                       D50.9, Iron deficiency anemia, unspecified                       K29.70, Gastritis, unspecified, without bleeding                       K26.9, Duodenal ulcer, unspecified as acute or chronic,                        without hemorrhage or perforation CPT copyright 2016 American Medical Association. All rights reserved. The codes documented in this report are preliminary and upon coder review may  be revised to meet current compliance requirements. Lucilla Lame MD, MD 10/05/2016 4:53:48 PM This report has been signed electronically. Number of Addenda:  0 Note Initiated On: 10/05/2016 4:38 PM      [facility]

## 2016-10-05 NOTE — Progress Notes (Signed)
Nurse asked [place] to visit with Pt. CH had seen this Pt twice before today. CH met with Pt and her husband who was at bedside. Pt stated she was being discharged and thanked [facility] for providing spiritual support for her during the time she was hospitalized. Pt asked for prayers, which [place] provided.    10/05/16 1600  Clinical Encounter Type  Visited With Patient;Family  Visit Type Follow-up;Spiritual support  Referral From [place]

## 2016-10-05 NOTE — Progress Notes (Signed)
Patient urine positive for EBSL and patient placed in contact isolation as ordered.

## 2016-10-05 NOTE — Progress Notes (Signed)
Tiffany Mcclain   DOB:[birth year]   [id number]    Subjective: Denies any fevers or chills. Appetite is good. Had IV iron infusion yesterday. Denies having any infusion reactions. Shortness of breath on exertion. No blood in stools black stools no diarrhea.   Objective:  Vitals:   10/05/16 0448 10/05/16 0855  BP: (!) 162/82 (!) 184/73  Pulse: 85 80  Resp: (!) 24 20  Temp: 98.4 F (36.9 C) 98.2 F (36.8 C)     Intake/Output Summary (Last 24 hours) at 10/05/16 1140 Last data filed at 10/05/16 0902  Gross per 24 hour  Intake             1142 ml  Output             1750 ml  Net             -608 ml    GENERAL Alert, no distress and comfortable. EYES: no pallor or icterus OROPHARYNX: no thrush or ulceration. NECK: supple, no masses felt LYMPH:  no palpable lymphadenopathy in the cervical, axillary or inguinal regions LUNGS: decreased breath sounds to auscultation at bases and  No wheeze or crackles HEART/CVS: regular rate & rhythm and no murmurs; No lower extremity edema ABDOMEN: abdomen soft, tender  on deep palpation. and normal bowel sounds Musculoskeletal:no cyanosis of digits and no clubbing  PSYCH: alert & oriented x 3 with fluent speech NEURO: no focal motor/sensory deficits SKIN:  no rashes or significant lesions   Labs:  Lab Results  Component Value Date   WBC 11.1 (H) 10/04/2016   HGB 7.8 (L) 10/05/2016   HCT 24.1 (L) 10/04/2016   MCV 74.7 (L) 10/04/2016   PLT 203 10/04/2016   NEUTROABS 8.6 (H) 10/03/2016    Lab Results  Component Value Date   NA 136 10/04/2016   K 3.5 10/04/2016   CL 109 10/04/2016   CO2 21 (L) 10/04/2016    Studies:  No results found.  Assessment & Plan:   # 71 year old female patient currently admitted to the hospital for pyelonephritis- noted to have severe anemia  # Severe anemia hemoglobin 5.5 status post PRBC transfusion. Iron studies suggestive of iron deficiency. Tolerated infusion of Venofer yesterday well. Proceed with 2  more infusions while in hospital. Today Hb- 7.8.  When patient discharged we'll could resume IV iron as an outpatient/the cancer Center.  # The etiology of iron deficiency is unclear.- This appears chronic; awaiting GI workup.  # Pyelonephritis/right ureteral calculus status post stenting- on antibiotics improving.   Cammie Sickle, MD 10/05/2016  11:40 AM

## 2016-10-05 NOTE — Anesthesia Preprocedure Evaluation (Signed)
Anesthesia Evaluation  Patient identified by MRN, date of birth, ID band Patient awake    Reviewed: Allergy & Precautions, NPO status , Patient's Chart, lab work & pertinent test results  History of Anesthesia Complications Negative for: history of anesthetic complications  Airway Mallampati: II  TM Distance: >3 FB Neck ROM: Full    Dental no notable dental hx.    Pulmonary shortness of breath (likely related to severe anemia), neg sleep apnea, neg COPD, former smoker,    breath sounds clear to auscultation- rhonchi (-) wheezing      Cardiovascular (-) hypertension(-) CAD and (-) Past MI  Rhythm:Regular Rate:Normal - Systolic murmurs and - Diastolic murmurs    Neuro/Psych  Headaches, PSYCHIATRIC DISORDERS Anxiety Depression    GI/Hepatic negative GI ROS, Neg liver ROS,   Endo/Other  negative endocrine ROSneg diabetes  Renal/GU Renal disease: nephrolithiasis.     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 No date: Depression No date: Family history of adverse reaction to anesthes*     Comment: father combative/AMS after anethesia  No date: Fracture of right humerus     Comment: hx of  No date: Headache     Comment: hx of migraines  No date: Shortness of breath dyspnea     Comment: climbing stairs   Reproductive/Obstetrics                             Anesthesia Physical Anesthesia Plan  ASA: III  Anesthesia Plan: General   Post-op Pain Management:    Induction: Intravenous  Airway Management Planned: Natural Airway  Additional Equipment:   Intra-op Plan:   Post-operative Plan:   Informed Consent: I have reviewed the  patients History and Physical, chart, labs and discussed the procedure including the risks, benefits and alternatives for the proposed anesthesia with the patient or authorized representative who has indicated his/her understanding and acceptance.   Dental advisory given  Plan Discussed with: CRNA and Anesthesiologist  Anesthesia Plan Comments:         Lab Results  Component Value Date   WBC 11.1 (H) 10/04/2016   HGB 7.8 (L) 10/05/2016   HCT 24.1 (L) 10/04/2016   MCV 74.7 (L) 10/04/2016   PLT 203 10/04/2016    Anesthesia Quick Evaluation

## 2016-10-05 NOTE — Anesthesia Postprocedure Evaluation (Signed)
Anesthesia Post Note  Patient: Rocio Chunn  Procedure(s) Performed: Procedure(s) (LRB): ESOPHAGOGASTRODUODENOSCOPY (EGD) (N/A)  Patient location during evaluation: Endoscopy Anesthesia Type: General Level of consciousness: awake and alert and oriented Pain management: pain level controlled Vital Signs Assessment: post-procedure vital signs reviewed and stable Respiratory status: spontaneous breathing, nonlabored ventilation and respiratory function stable Cardiovascular status: blood pressure returned to baseline and stable Postop Assessment: no signs of nausea or vomiting Anesthetic complications: no     Last Vitals:  Vitals:   10/05/16 1720 10/05/16 1730  BP: (!) 196/85 (!) 191/82  Pulse: 81 79  Resp: 17 16  Temp:      Last Pain:  Vitals:   10/05/16 1700  TempSrc: Tympanic  PainSc:                  Alonah Lineback

## 2016-10-06 ENCOUNTER — Encounter: Payer: Self-pay | Admitting: Gastroenterology

## 2016-10-06 MED ORDER — SULFAMETHOXAZOLE-TRIMETHOPRIM 800-160 MG PO TABS: 1.0000 | ORAL_TABLET | Freq: Two times a day (BID) | ORAL | 0 refills | Status: DC

## 2016-10-06 MED ORDER — PANTOPRAZOLE SODIUM 40 MG PO TBEC: 40.0000 mg | DELAYED_RELEASE_TABLET | Freq: Two times a day (BID) | ORAL | 0 refills | Status: DC

## 2016-10-06 NOTE — Care Management Important Message (Signed)
Important Message  Patient Details  Name: Tiffany Mcclain MRN: [id number] Date of Birth: 11-29-1945   Medicare Important Message Given:  Yes    Beverly Sessions, RN 10/06/2016, 11:21 AM

## 2016-10-06 NOTE — Progress Notes (Signed)
[place] at [place] NAME: Tiffany Mcclain    MR#:  [id number]  DATE OF BIRTH:  04/10/1946  SUBJECTIVE:   No abd pain Febrile to 101 during EGD 1 non bleeding ulcer found No melena  REVIEW OF SYSTEMS:   Review of Systems  Constitutional: Negative for chills and fever.  HENT: Negative for sore throat.   Eyes: Negative for blurred vision, double vision and pain.  Respiratory: Negative for cough, hemoptysis, shortness of breath and wheezing.   Cardiovascular: Negative for chest pain, palpitations, orthopnea and leg swelling.  Gastrointestinal: Negative for abdominal pain, constipation, diarrhea, heartburn, nausea and vomiting.  Genitourinary: Negative for dysuria and hematuria.  Musculoskeletal: Negative for back pain and joint pain.  Skin: Negative for rash.  Neurological: Negative for sensory change, speech change, focal weakness and headaches.  Endo/Heme/Allergies: Does not bruise/bleed easily.  Psychiatric/Behavioral: Negative for depression. The patient is not nervous/anxious.      DRUG ALLERGIES:   Allergies  Allergen Reactions  . Codeine Nausea Only    "clammy feeling"    VITALS:  Blood pressure (!) 165/76, pulse 76, temperature 98.4 F (36.9 C), temperature source Oral, resp. rate 16, height 5\' 4"  (1.626 m), weight 91.6 kg (202 lb), SpO2 97 %.  PHYSICAL EXAMINATION:   Physical Exam  GENERAL:  71 y.o.-year-old patient lying in the bed with no acute distress.  EYES: Pupils equal, round, reactive to light and accommodation. No scleral icterus. Extraocular muscles intact.  HEENT: Head atraumatic, normocephalic. Oropharynx and nasopharynx clear.  NECK:  Supple, no jugular venous distention. No thyroid enlargement, no tenderness.  LUNGS: Normal breath sounds bilaterally, no wheezing, rales, rhonchi. No use of accessory muscles of respiration.  CARDIOVASCULAR: S1, S2 normal. No murmurs, rubs, or gallops.  ABDOMEN:  Soft, nontender, nondistended. Bowel sounds present. No organomegaly or mass.  EXTREMITIES: No cyanosis, clubbing or edema b/l.    NEUROLOGIC: Cranial nerves II through XII are intact. No focal Motor or sensory deficits b/l.   PSYCHIATRIC:  patient is alert and oriented x 3.  SKIN: No obvious rash, lesion, or ulcer.   LABORATORY PANEL:  CBC  Recent Labs Lab 10/04/16 0445 10/05/16 0501  WBC 11.1*  --   HGB 7.4* 7.8*  HCT 24.1*  --   PLT 203  --     Chemistries   Recent Labs Lab 10/04/16 0445  NA 136  K 3.5  CL 109  CO2 21*  GLUCOSE 120*  BUN 10  CREATININE 0.65  CALCIUM 7.5*  AST 16  ALT 14  ALKPHOS 76  BILITOT 0.7   Cardiac Enzymes  Recent Labs Lab 10/03/16 0617  TROPONINI <0.03   RADIOLOGY:  No results found. ASSESSMENT AND PLAN:  Tiffany Mcclain  is a 71 y.o. female with a known history of Depression, anxiety and arthritis presents to the hospital complaining of right lower quadrant and flank pain along with dysuria and fever. She had 101.6 temperature at home. 99.6 on arrival to emergency room along with significant tachycardia.  1. Acute right UPJ stone with obstruction s/p cystoscopy and stent placement -feels better -IV rocephin changed to Bactrim for ESBL per sensitivities UC 80K E coli -no fevers  2. Chronic Iron deficiency anemia with low MCV and low serum iron stores -came in with hgb of 6.2--5.5-- 3 units BT-- 7.8 -pt has been using NSAIDS  Three times a day for neck and back pain. Denies any melena -GI consult for EGD -Hematology consult  for IV iron and out pt f/u -po PPI bid Hemoccult stools negative EGD- Gastritis and 1 non bleeding ulcer  3.elevated ALT ?unclear 1200---16 resolved  4.DVT prophylaxis SCD  Case discussed with Care Management/Social Worker. Management plans discussed with the patient, family and they are in agreement.  CODE STATUS: FUlll  DVT Prophylaxis: SCD  TOTAL TIME TAKING CARE OF THIS PATIENT: 30  minutes.   POSSIBLE D/C IN 1-2 DAYS, DEPENDING ON CLINICAL CONDITION.  Note: This dictation was prepared with Dragon dictation along with smaller phrase technology. Any transcriptional errors that result from this process are unintentional.  Hillary Bow R M.D on 10/06/2016 at 6:56 AM  Between 7am to 6pm - Pager - [phone]  After 6pm go to [email or web address] - password [email or web address]  Office  [phone]  CC: Primary care physician; Albina Billet, MD

## 2016-10-06 NOTE — Progress Notes (Signed)
Pt d/c to home today.  IV removed intact.  Rx's given to pt w/all questions and concerns addressed.  D/C paperwork reviewed and education provided with all questions and concerns addressed.  Pt husband at bedside for home transport.  

## 2016-10-08 LAB — CULTURE, BLOOD (ROUTINE X 2)
CULTURE: NO GROWTH
Culture: NO GROWTH
Special Requests: ADEQUATE
Special Requests: ADEQUATE

## 2016-10-10 ENCOUNTER — Telehealth: Payer: Self-pay | Admitting: Radiology

## 2016-10-10 ENCOUNTER — Other Ambulatory Visit: Payer: Self-pay | Admitting: Radiology

## 2016-10-10 DIAGNOSIS — N200 Calculus of kidney: Secondary | ICD-10-CM

## 2016-10-10 NOTE — Telephone Encounter (Signed)
Notified pt of surgery scheduled with Dr Pilar Jarvis on 10/18/16, pre-admit testing appt & to call day prior to surgery for arrival time to SDS. Questions answered. Pt voices understanding.

## 2016-10-10 NOTE — Telephone Encounter (Signed)
-----   Message from Alexis Frock, MD sent at 10/03/2016  1:28 PM EDT ----- Regarding: Rt Ureteroscopy This lady will need cysto, Rt retrograde, Rt ureteroscopy with laser lithotripsy, Rt stent exchange in 2-3 weeks. Rt ureteral stone getting stent on 5/1.  Thanks, T [place]

## 2016-10-11 NOTE — Discharge Summary (Signed)
[place] at [place] NAME: Tiffany Mcclain    MR#:  [id number]  DATE OF BIRTH:  12-26-1945  DATE OF ADMISSION:  10/03/2016 ADMITTING PHYSICIAN: Hillary Bow, MD  DATE OF DISCHARGE: 10/06/2016 12:38 PM  PRIMARY CARE PHYSICIAN: Albina Billet, MD   ADMISSION DIAGNOSIS:  Hypokalemia [E87.6] Acute renal insufficiency [N28.9] Abdominal pain, RLQ [R10.31] Severe sepsis ([place]) [A41.9, R65.20] Fever, unspecified fever cause [R50.9] Anemia, unspecified type [D64.9]  DISCHARGE DIAGNOSIS:  Active Problems:   Pyelonephritis   Iron deficiency anemia due to chronic blood loss   DU (duodenal ulcer)   SECONDARY DIAGNOSIS:   Past Medical History:  Diagnosis Date  . Anxiety   . Arthritis   . Clubbed foot    left  . Depression   . Family history of adverse reaction to anesthesia    father combative/AMS after anethesia   . Fracture of right humerus    hx of   . Headache    hx of migraines   . Shortness of breath dyspnea    climbing stairs     ADMITTING HISTORY  HISTORY OF PRESENT ILLNESS:  Tiffany Mcclain  is a 71 y.o. female with a known history of Depression, anxiety and arthritis presents to the hospital complaining of right lower quadrant and flank pain along with dysuria and fever. She had 101.6 temperature at home. 99.6 on arrival to emergency room along with significant tachycardia. Here patient has not had vomiting. CT scan of the abdomen and pelvis showed some mild right hydroureteronephrosis with possible 1 mm right ureteral stone. She did have a right hip replacement limiting radiology read on the stone.   HOSPITAL COURSE:   Tiffany Mcclain a 71 y.o.femalewith a known history of Depression, anxiety and arthritis presents to the hospital complaining of right lower quadrant and flank pain along with dysuria and fever. She had 101.6 temperature at home. 99.6 on arrival to emergency room along with significant  tachycardia.  1. Acute right UPJ stone with obstruction s/p cystoscopy and stent placement -feels better -IV rocephin changed to Bactrim for ESBL per sensitivities UC 80K E coli -no fevers Will need follow-up with urology as outpatient for stent removal.  2. Chronic Iron deficiency anemia with low MCV and low serum iron stores -came in with hgb of 6.2--5.5-- 3 units BT-- 7.8 -pt has been using NSAIDS  Three times a day for neck and back pain. Denies any melena. Advised to not take any NSAIDs. -GI consulted for EGD -Hematology consulted. Patient received IV iron in the hospital. Will follow-up with Dr. Rogue Bussing at the cancer center. Hemoccult stools negative EGD showed- Gastritis and 1 non bleeding ulcer Started on PPI  3.elevated ALT ?unclear 1200---16 resolved Could be lab error  4.DVT prophylaxis SCD in the hospital due to anemia  Patient discharged home in a stable condition with antibiotics, PPIs. Follow-up with GI, hematology and urology.  CONSULTS OBTAINED:  Treatment Team:  Cleon Gustin, MD Cammie Sickle, MD Lucilla Lame, MD  DRUG ALLERGIES:   Allergies  Allergen Reactions  . Codeine Nausea Only    "clammy feeling"  . Other Diarrhea    Red Meat    DISCHARGE MEDICATIONS:   Discharge Medication List as of 10/06/2016  1:01 PM    START taking these medications   Details  ferrous sulfate 325 (65 FE) MG tablet Take 1 tablet (325 mg total) by mouth daily with breakfast., Starting Thu 10/05/2016, Normal    pantoprazole (PROTONIX)  40 MG tablet Take 1 tablet (40 mg total) by mouth 2 (two) times daily before a meal., Starting Fri 10/06/2016, Print      CONTINUE these medications which have CHANGED   Details  sulfamethoxazole-trimethoprim (BACTRIM DS,SEPTRA DS) 800-160 MG tablet Take 1 tablet by mouth 2 (two) times daily., Starting Fri 10/06/2016, Print      CONTINUE these medications which have NOT CHANGED   Details  escitalopram (LEXAPRO) 20 MG  tablet Take 20 mg by mouth every morning., Until Discontinued, Historical Med    Multiple Vitamins-Minerals (PRESERVISION AREDS 2 PO) Take 1 tablet by mouth 2 (two) times daily., Historical Med    acetaminophen (TYLENOL ARTHRITIS PAIN) 650 MG CR tablet Take 650 mg by mouth every 8 (eight) hours as needed for pain., Until Discontinued, Historical Med    diphenhydrAMINE (SOMINEX) 25 MG tablet Take 25 mg by mouth 2 (two) times daily., Until Discontinued, Historical Med    docusate sodium (COLACE) 100 MG capsule Take 100 mg by mouth every morning., Until Discontinued, Historical Med      STOP taking these medications     methocarbamol (ROBAXIN) 500 MG tablet      oxyCODONE (OXY IR/ROXICODONE) 5 MG immediate release tablet      rivaroxaban (XARELTO) 10 MG TABS tablet         Today   VITAL SIGNS:  Blood pressure (!) 165/76, pulse 76, temperature 98.4 F (36.9 C), temperature source Oral, resp. rate 16, height 5\' 4"  (1.626 m), weight 91.6 kg (202 lb), SpO2 97 %.  I/O:  No intake or output data in the 24 hours ending 10/11/16 1305  PHYSICAL EXAMINATION:  Physical Exam  GENERAL:  72 y.o.-year-old patient lying in the bed with no acute distress.  LUNGS: Normal breath sounds bilaterally, no wheezing, rales,rhonchi or crepitation. No use of accessory muscles of respiration.  CARDIOVASCULAR: S1, S2 normal. No murmurs, rubs, or gallops.  ABDOMEN: Soft, non-tender, non-distended. Bowel sounds present. No organomegaly or mass.  NEUROLOGIC: Moves all 4 extremities. PSYCHIATRIC: The patient is alert and oriented x 3.  SKIN: No obvious rash, lesion, or ulcer.   DATA REVIEW:   CBC  Recent Labs Lab 10/05/16 0501  HGB 7.8*    Chemistries  No results for input(s): NA, K, CL, CO2, GLUCOSE, BUN, CREATININE, CALCIUM, MG, AST, ALT, ALKPHOS, BILITOT in the last 168 hours.  Invalid input(s): GFRCGP  Cardiac Enzymes No results for input(s): TROPONINI in the last 168 hours.  Microbiology  Results  Results for orders placed or performed during the hospital encounter of 10/03/16  Blood Culture (routine x 2)     Status: None   Collection Time: 10/03/16  6:17 AM  Result Value Ref Range Status   Specimen Description BLOOD L AC  Final   Special Requests   Final    BOTTLES DRAWN AEROBIC AND ANAEROBIC Blood Culture adequate volume   Culture NO GROWTH 5 DAYS  Final   Report Status 10/08/2016 FINAL  Final  Blood Culture (routine x 2)     Status: None   Collection Time: 10/03/16  6:17 AM  Result Value Ref Range Status   Specimen Description BLOOD R FA  Final   Special Requests   Final    BOTTLES DRAWN AEROBIC AND ANAEROBIC Blood Culture adequate volume   Culture NO GROWTH 5 DAYS  Final   Report Status 10/08/2016 FINAL  Final  Urine culture     Status: Abnormal   Collection Time: 10/03/16  6:17 AM  Result Value Ref Range Status   Specimen Description URINE, RANDOM  Final   Special Requests NONE  Final   Culture (A)  Final    80,000 COLONIES/mL ESCHERICHIA COLI Confirmed Extended Spectrum Beta-Lactamase Producer (ESBL) Performed at [facility], [place] [address] [place], [place] [address]    Report Status 10/05/2016 FINAL  Final   Organism ID, Bacteria ESCHERICHIA COLI (A)  Final      Susceptibility   Escherichia coli - MIC*    AMPICILLIN >=32 RESISTANT Resistant     CEFAZOLIN >=64 RESISTANT Resistant     CEFTRIAXONE >=64 RESISTANT Resistant     CIPROFLOXACIN >=4 RESISTANT Resistant     GENTAMICIN <=1 SENSITIVE Sensitive     IMIPENEM <=0.25 SENSITIVE Sensitive     NITROFURANTOIN 64 INTERMEDIATE Intermediate     TRIMETH/SULFA <=20 SENSITIVE Sensitive     AMPICILLIN/SULBACTAM >=32 RESISTANT Resistant     PIP/TAZO <=4 SENSITIVE Sensitive     Extended ESBL POSITIVE Resistant     * 80,000 COLONIES/mL ESCHERICHIA COLI    RADIOLOGY:  No results found.  Follow up with PCP in 1 week.  Management plans discussed with the patient, family and they are in  agreement.  CODE STATUS:  Code Status History    Date Active Date Inactive Code Status Order ID Comments User Context   10/03/2016 10:38 AM 10/06/2016  3:44 PM Full Code [id number]  Hillary Bow, MD ED   05/04/2014  5:29 PM 05/06/2014  4:04 PM Full Code [id number]  Aluisio, Dione Plover, MD Inpatient      TOTAL TIME TAKING CARE OF THIS PATIENT ON DAY OF DISCHARGE: more than 30 minutes.   Hillary Bow R M.D on 10/11/2016 at 1:05 PM  Between 7am to 6pm - Pager - [phone]  After 6pm go to [email or web address] - password [email or web address] [facility]  Office  [phone]  CC: Primary care physician; Albina Billet, MD  Note: This dictation was prepared with Dragon dictation along with smaller phrase technology. Any transcriptional errors that result from this process are unintentional.

## 2016-10-12 ENCOUNTER — Encounter
Admission: RE | Admit: 2016-10-12 | Discharge: 2016-10-12 | Disposition: A | Discharge status: Home / Self Care | Payer: Medicare Other | Source: Ambulatory Visit | Attending: Urology | Admitting: Urology

## 2016-10-12 DIAGNOSIS — N201 Calculus of ureter: Secondary | ICD-10-CM | POA: Insufficient documentation

## 2016-10-12 DIAGNOSIS — Z01812 Encounter for preprocedural laboratory examination: Secondary | ICD-10-CM | POA: Insufficient documentation

## 2016-10-12 HISTORY — DX: Anemia, unspecified: D64.9

## 2016-10-12 HISTORY — DX: Personal history of urinary calculi: Z87.442

## 2016-10-12 HISTORY — DX: Personal history of other diseases of the digestive system: Z87.19

## 2016-10-12 HISTORY — DX: Duodenal ulcer, unspecified as acute or chronic, without hemorrhage or perforation: K26.9

## 2016-10-12 HISTORY — DX: Gastro-esophageal reflux disease without esophagitis: K21.9

## 2016-10-12 HISTORY — DX: Urinary tract infection, site not specified: N39.0

## 2016-10-12 LAB — CBC
HCT: 38.1 % (ref 35.0–47.0)
Hemoglobin: 11.8 g/dL — ABNORMAL LOW (ref 12.0–16.0)
MCH: 24.3 pg — AB (ref 26.0–34.0)
MCHC: 30.9 g/dL — AB (ref 32.0–36.0)
MCV: 78.7 fL — ABNORMAL LOW (ref 80.0–100.0)
PLATELETS: 470 10*3/uL — AB (ref 150–440)
RBC: 4.84 MIL/uL (ref 3.80–5.20)
RDW: 21.2 % — ABNORMAL HIGH (ref 11.5–14.5)
WBC: 7.2 10*3/uL (ref 3.6–11.0)

## 2016-10-12 LAB — URINALYSIS, ROUTINE W REFLEX MICROSCOPIC
BACTERIA UA: NONE SEEN
BILIRUBIN URINE: NEGATIVE
Glucose, UA: NEGATIVE mg/dL
KETONES UR: NEGATIVE mg/dL
Nitrite: NEGATIVE
PH: 5 (ref 5.0–8.0)
Protein, ur: 30 mg/dL — AB
Specific Gravity, Urine: 1.015 (ref 1.005–1.030)

## 2016-10-12 NOTE — Patient Instructions (Signed)
  Your procedure is scheduled on: Oct 18, 2016 (Wednesday) Report to Same Day Surgery 2nd floor medical mall [facility] Entrance-take elevator on left to 2nd floor.  Check in with surgery information desk.) To find out your arrival time please call [phone] between 1PM - 3PM on Oct 17, 2016 (Tuesday)   Remember: Instructions that are not followed completely may result in serious medical risk, up to and including death, or upon the discretion of your surgeon and anesthesiologist your surgery may need to be rescheduled.    _x___ 1. Do not eat food or drink liquids after midnight. No gum chewing or hard candies                              __x__ 2. No Alcohol for 24 hours before or after surgery.   __x__3. No Smoking for 24 prior to surgery.   ____  4. Bring all medications with you on the day of surgery if instructed.    __x__ 5. Notify your doctor if there is any change in your medical condition     (cold, fever, infections).     Do not wear jewelry, make-up, hairpins, clips or nail polish.  Do not wear lotions, powders, or perfumes. You may wear deodorant.  Do not shave 48 hours prior to surgery. Men may shave face and neck.  Do not bring valuables to the hospital.    [facility] is not responsible for any belongings or valuables.               Contacts, dentures or bridgework may not be worn into surgery.  Leave your suitcase in the car. After surgery it may be brought to your room.  For patients admitted to the hospital, discharge time is determined by your   treatment team                       Patients discharged the day of surgery will not be allowed to drive home.  You will need someone to drive you home and stay with you the night of your procedure.    Please read over the following fact sheets that you were given:   [facility] Preparing for Surgery and or MRSA Information   _x___ Take anti-hypertensive (unless it includes a diuretic), cardiac, seizure, asthma,      anti-reflux and psychiatric medicines with a sip of water. These include:  1. PANTOPRAZOLE  2. LEXAPRO  3.  4.  5.  6.  ____Fleets enema or Magnesium Citrate as directed.   __ Use CHG Soap or sage wipes as directed on instruction sheet   ____ Use inhalers on the day of surgery and bring to hospital day of surgery  ____ Stop Metformin and Janumet 2 days prior to surgery.    ____ Take 1/2 of usual insulin dose the night before surgery and none on the morning surgery     _x___ Follow recommendations from Cardiologist, Pulmonologist or PCP regarding          stopping Aspirin, Coumadin, Pllavix ,Eliquis, Effient, or Pradaxa, and Pletal.  X____Stop Anti-inflammatories such as Advil, Aleve, Ibuprofen, Motrin, Naproxen, Naprosyn, Goodies powders or aspirin products. OK to take Tylenol    _x___ Stop supplements until after surgery.  But may continue Vitamin D, Vitamin B, and multivitamin.         ____ Bring C-Pap to the hospital.

## 2016-10-13 LAB — URINE CULTURE: Culture: <10000 — AB

## 2016-10-13 NOTE — Pre-Procedure Instructions (Signed)
UA FAXED AND LM FOR AMY AT DR Pilar Jarvis

## 2016-10-17 MED ORDER — DEXTROSE 5 % IV SOLN
1.0000 g | INTRAVENOUS | Status: AC
  Administered 2016-10-18: 1 g via INTRAVENOUS
  Filled 2016-10-17: qty 10

## 2016-10-18 ENCOUNTER — Ambulatory Visit: Payer: Medicare Other | Admitting: Registered Nurse

## 2016-10-18 ENCOUNTER — Ambulatory Visit
Admission: RE | Admit: 2016-10-18 | Discharge: 2016-10-18 | Disposition: A | Discharge status: Home / Self Care | Payer: Medicare Other | Source: Ambulatory Visit | Attending: Urology | Admitting: Urology

## 2016-10-18 ENCOUNTER — Telehealth: Payer: Self-pay | Admitting: Urology

## 2016-10-18 ENCOUNTER — Encounter: Payer: Self-pay | Admitting: *Deleted

## 2016-10-18 ENCOUNTER — Encounter
Admission: RE | Disposition: A | Discharge status: Home / Self Care | Payer: Self-pay | Source: Ambulatory Visit | Attending: Urology

## 2016-10-18 DIAGNOSIS — Z96641 Presence of right artificial hip joint: Secondary | ICD-10-CM | POA: Insufficient documentation

## 2016-10-18 DIAGNOSIS — Z87891 Personal history of nicotine dependence: Secondary | ICD-10-CM | POA: Insufficient documentation

## 2016-10-18 DIAGNOSIS — D649 Anemia, unspecified: Secondary | ICD-10-CM | POA: Insufficient documentation

## 2016-10-18 DIAGNOSIS — R740 Nonspecific elevation of levels of transaminase and lactic acid dehydrogenase [LDH]: Secondary | ICD-10-CM | POA: Insufficient documentation

## 2016-10-18 DIAGNOSIS — N1330 Unspecified hydronephrosis: Secondary | ICD-10-CM | POA: Diagnosis present

## 2016-10-18 DIAGNOSIS — N201 Calculus of ureter: Secondary | ICD-10-CM | POA: Diagnosis not present

## 2016-10-18 DIAGNOSIS — N132 Hydronephrosis with renal and ureteral calculous obstruction: Secondary | ICD-10-CM | POA: Diagnosis not present

## 2016-10-18 DIAGNOSIS — K219 Gastro-esophageal reflux disease without esophagitis: Secondary | ICD-10-CM | POA: Diagnosis not present

## 2016-10-18 DIAGNOSIS — Z711 Person with feared health complaint in whom no diagnosis is made: Secondary | ICD-10-CM | POA: Diagnosis not present

## 2016-10-18 DIAGNOSIS — N200 Calculus of kidney: Secondary | ICD-10-CM

## 2016-10-18 HISTORY — PX: CYSTOSCOPY W/ RETROGRADES: SHX1426

## 2016-10-18 HISTORY — PX: CYSTOSCOPY W/ URETERAL STENT PLACEMENT: SHX1429

## 2016-10-18 HISTORY — PX: URETEROSCOPY: SHX842

## 2016-10-18 SURGERY — CYSTOSCOPY, WITH RETROGRADE PYELOGRAM
Anesthesia: General | Site: Ureter | Laterality: Right | Wound class: Clean Contaminated

## 2016-10-18 MED ORDER — MIDAZOLAM HCL 2 MG/2ML IJ SOLN
INTRAMUSCULAR | Status: AC
  Filled 2016-10-18: qty 2

## 2016-10-18 MED ORDER — LIDOCAINE HCL (PF) 2 % IJ SOLN
INTRAMUSCULAR | Status: AC
  Filled 2016-10-18: qty 2

## 2016-10-18 MED ORDER — ONDANSETRON HCL 4 MG/2ML IJ SOLN: 4.0000 mg | Freq: Once | INTRAMUSCULAR | Status: DC | PRN

## 2016-10-18 MED ORDER — PHENYLEPHRINE HCL 10 MG/ML IJ SOLN
INTRAMUSCULAR | Status: DC | PRN
  Administered 2016-10-18 (×3): 200 ug via INTRAVENOUS

## 2016-10-18 MED ORDER — PROPOFOL 10 MG/ML IV BOLUS
INTRAVENOUS | Status: DC | PRN
  Administered 2016-10-18: 150 mg via INTRAVENOUS
  Administered 2016-10-18: 30 mg via INTRAVENOUS

## 2016-10-18 MED ORDER — DEXAMETHASONE SODIUM PHOSPHATE 10 MG/ML IJ SOLN
INTRAMUSCULAR | Status: AC
  Filled 2016-10-18: qty 1

## 2016-10-18 MED ORDER — SULFAMETHOXAZOLE-TRIMETHOPRIM 800-160 MG PO TABS: 1.0000 | ORAL_TABLET | Freq: Two times a day (BID) | ORAL | 0 refills | Status: DC

## 2016-10-18 MED ORDER — DEXAMETHASONE SODIUM PHOSPHATE 10 MG/ML IJ SOLN
INTRAMUSCULAR | Status: DC | PRN
  Administered 2016-10-18: 10 mg via INTRAVENOUS

## 2016-10-18 MED ORDER — LIDOCAINE HCL (CARDIAC) 20 MG/ML IV SOLN
INTRAVENOUS | Status: DC | PRN
  Administered 2016-10-18: 80 mg via INTRAVENOUS

## 2016-10-18 MED ORDER — FENTANYL CITRATE (PF) 100 MCG/2ML IJ SOLN
INTRAMUSCULAR | Status: AC
  Filled 2016-10-18: qty 2

## 2016-10-18 MED ORDER — PROPOFOL 10 MG/ML IV BOLUS
INTRAVENOUS | Status: AC
  Filled 2016-10-18: qty 40

## 2016-10-18 MED ORDER — GENTAMICIN SULFATE 40 MG/ML IJ SOLN
120.0000 mg | INTRAVENOUS | Status: AC
  Administered 2016-10-18: 120 mg via INTRAVENOUS
  Filled 2016-10-18: qty 3

## 2016-10-18 MED ORDER — MIDAZOLAM HCL 2 MG/2ML IJ SOLN
INTRAMUSCULAR | Status: DC | PRN
  Administered 2016-10-18: 2 mg via INTRAVENOUS

## 2016-10-18 MED ORDER — FENTANYL CITRATE (PF) 100 MCG/2ML IJ SOLN: 25.0000 ug | INTRAMUSCULAR | Status: DC | PRN

## 2016-10-18 MED ORDER — ONDANSETRON HCL 4 MG/2ML IJ SOLN
INTRAMUSCULAR | Status: DC | PRN
  Administered 2016-10-18: 4 mg via INTRAVENOUS

## 2016-10-18 MED ORDER — ONDANSETRON HCL 4 MG/2ML IJ SOLN
INTRAMUSCULAR | Status: AC
  Filled 2016-10-18: qty 2

## 2016-10-18 MED ORDER — HYDROCODONE-ACETAMINOPHEN 5-325 MG PO TABS: 1.0000 | ORAL_TABLET | ORAL | 0 refills | Status: DC | PRN

## 2016-10-18 MED ORDER — GLYCOPYRROLATE 0.2 MG/ML IJ SOLN
INTRAMUSCULAR | Status: DC | PRN
  Administered 2016-10-18: 0.2 mg via INTRAVENOUS

## 2016-10-18 MED ORDER — PROPOFOL 10 MG/ML IV BOLUS
INTRAVENOUS | Status: AC
  Filled 2016-10-18: qty 20

## 2016-10-18 MED ORDER — SODIUM CHLORIDE 0.9 % IV SOLN
INTRAVENOUS | Status: DC
  Administered 2016-10-18: 09:00:00 via INTRAVENOUS

## 2016-10-18 MED ORDER — IOTHALAMATE MEGLUMINE 43 % IV SOLN
INTRAVENOUS | Status: DC | PRN
  Administered 2016-10-18: 15 mL

## 2016-10-18 MED ORDER — SUGAMMADEX SODIUM 500 MG/5ML IV SOLN
INTRAVENOUS | Status: AC
  Filled 2016-10-18: qty 5

## 2016-10-18 MED ORDER — FENTANYL CITRATE (PF) 100 MCG/2ML IJ SOLN
INTRAMUSCULAR | Status: DC | PRN
  Administered 2016-10-18: 25 ug via INTRAVENOUS

## 2016-10-18 SURGICAL SUPPLY — 31 items
BACTOSHIELD CHG 4% 4OZ (MISCELLANEOUS) ×2
BASKET ZERO TIP 1.9FR (BASKET) IMPLANT
CATH URETL 5X70 OPEN END (CATHETERS) ×4 IMPLANT
CNTNR SPEC 2.5X3XGRAD LEK (MISCELLANEOUS) ×2
CONRAY 43 FOR UROLOGY 50M (MISCELLANEOUS) ×4 IMPLANT
CONT SPEC 4OZ STER OR WHT (MISCELLANEOUS) ×2
CONTAINER SPEC 2.5X3XGRAD LEK (MISCELLANEOUS) ×2 IMPLANT
FIBER LASER LITHO 273 (Laser) IMPLANT
GLOVE BIO SURGEON STRL SZ7 (GLOVE) ×8 IMPLANT
GLOVE BIO SURGEON STRL SZ7.5 (GLOVE) ×4 IMPLANT
GOWN STRL REUS W/ TWL LRG LVL3 (GOWN DISPOSABLE) ×2 IMPLANT
GOWN STRL REUS W/ TWL LRG LVL4 (GOWN DISPOSABLE) ×2 IMPLANT
GOWN STRL REUS W/TWL LRG LVL3 (GOWN DISPOSABLE) ×2
GOWN STRL REUS W/TWL LRG LVL4 (GOWN DISPOSABLE) ×2
GOWN STRL REUS W/TWL XL LVL3 (GOWN DISPOSABLE) ×4 IMPLANT
GUIDEWIRE SUPER STIFF (WIRE) IMPLANT
INTRODUCER DILATOR DOUBLE (INTRODUCER) ×4 IMPLANT
KIT RM TURNOVER CYSTO AR (KITS) ×4 IMPLANT
PACK CYSTO AR (MISCELLANEOUS) ×4 IMPLANT
SCRUB CHG 4% DYNA-HEX 4OZ (MISCELLANEOUS) ×2 IMPLANT
SENSORWIRE 0.038 NOT ANGLED (WIRE) ×4
SET CYSTO W/LG BORE CLAMP LF (SET/KITS/TRAYS/PACK) ×4 IMPLANT
SHEATH URETERAL 13/15X36 1L (SHEATH) IMPLANT
SOL .9 NS 3000ML IRR  AL (IV SOLUTION) ×2
SOL .9 NS 3000ML IRR UROMATIC (IV SOLUTION) ×2 IMPLANT
STENT URET 6FRX24 CONTOUR (STENTS) IMPLANT
STENT URET 6FRX26 CONTOUR (STENTS) ×4 IMPLANT
SURGILUBE 2OZ TUBE FLIPTOP (MISCELLANEOUS) ×4 IMPLANT
SYRINGE IRR TOOMEY STRL 70CC (SYRINGE) ×4 IMPLANT
WATER STERILE IRR 1000ML POUR (IV SOLUTION) ×4 IMPLANT
WIRE SENSOR 0.038 NOT ANGLED (WIRE) ×2 IMPLANT

## 2016-10-18 NOTE — Progress Notes (Signed)
Voided no complaints of pain

## 2016-10-18 NOTE — Telephone Encounter (Signed)
Orders placed.

## 2016-10-18 NOTE — Telephone Encounter (Signed)
Can you put this RUS in for Dr. Pilar Jarvis please?  Thanks,  Sharyn Lull

## 2016-10-18 NOTE — Interval H&P Note (Signed)
History and Physical Interval Note:  10/18/2016 10:02 AM  Tiffany Mcclain  has presented today for surgery, with the diagnosis of right upj stone  The various methods of treatment have been discussed with the patient and family. After consideration of risks, benefits and other options for treatment, the patient has consented to  Procedure(s): CYSTOSCOPY WITH RETROGRADE PYELOGRAM (Right) URETEROSCOPY WITH HOLMIUM LASER LITHOTRIPSY (Right) CYSTOSCOPY WITH STENT REPLACEMENT (Right) as a surgical intervention .  The patient's history has been reviewed, patient examined, no change in status, stable for surgery.  I have reviewed the patient's chart and labs.  Questions were answered to the patient's satisfaction.    The patient presents today after undergoing right ureteral stent placement in the setting of sepsis. Her urine culture is now become clear after growing ESBL at time of stent placement. We discussed the risks, benefits, indications for left ureteroscopy. She understands the goal today is to remove the stone. She understands the risks include but are not limited to bleeding, infection, need for repeat procedures, and iatrogenic injury. She does know that she will need a stent postoperatively temporarily. The patient elected to proceed.  RRR Lungs clear  Nickie Retort

## 2016-10-18 NOTE — Anesthesia Procedure Notes (Signed)
Procedure Name: LMA Insertion Date/Time: 10/18/2016 10:21 AM Performed by: Doreen Salvage Pre-anesthesia Checklist: Patient identified, Patient being monitored, Timeout performed, Emergency Drugs available and Suction available Patient Re-evaluated:Patient Re-evaluated prior to inductionOxygen Delivery Method: Circle system utilized Preoxygenation: Pre-oxygenation with 100% oxygen Intubation Type: IV induction Ventilation: Mask ventilation without difficulty LMA: LMA inserted LMA Size: 3.5 Tube type: Oral Number of attempts: 1 Placement Confirmation: positive ETCO2 and breath sounds checked- equal and bilateral Tube secured with: Tape Dental Injury: Teeth and Oropharynx as per pre-operative assessment

## 2016-10-18 NOTE — H&P (View-Only) (Signed)
Reason for Consult: Right Hydronephrosis / Possible Small Ureteral Stone, Suspect Pyelonephritis  Referring Physician: Hillary Bow MD  Tiffany Mcclain is an 71 y.o. female.   HPI:   1 - Right Hydronephrosis / Possible Small Ureteral Stone - mild-moderate hydro to level of distal ureter on ER CT 10/03/16. Questionable small (<65m) distal stone, though ipsilateral hip artroplasty makes interpretation difficult. No renal stones. No prior colic.   2 - Suspect Pyelonephritis - low grade fevers, lactic acidosis to 6 and some perinephric stranding on CT concerning for possible pyleonephritis. BCX, UCX 5/1 obtained and pending. Placed on empiric Rocephin. UA w/o bacteruria or nitrites.  She reports acute malaise and fevers to 102 at home last night.   PMH sig for Rt total hip, benign hyst.   Today " Tiffany Mcclain " (pronounce All-een) is seen in consultation for above. She is referred by Dr. SDarvin Neighbours She is also noted to be severely anemic and with significant transaminitis.   Past Medical History:  Diagnosis Date  . Anxiety   . Arthritis   . Clubbed foot    left  . Depression   . Family history of adverse reaction to anesthesia    father combative/AMS after anethesia   . Fracture of right humerus    hx of   . Headache    hx of migraines   . Shortness of breath dyspnea    climbing stairs    Past Surgical History:  Procedure Laterality Date  . ABDOMINAL HYSTERECTOMY    . JOINT REPLACEMENT     Right hip  . 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   Procedure: RIGHT TOTAL HIP ARTHROPLASTY ANTERIOR APPROACH;  Surgeon: FGearlean Alf MD;  Location: [facility];  Service: Orthopedics;  Laterality: Right;    Family History  Problem Relation Age of Onset  . CAD Neg Hx     Social History:  reports that she quit smoking about 2 years ago. Her smoking use included Cigarettes. She smoked 0.50 packs per day. She has never used smokeless tobacco. She reports that she drinks about 1.2 oz of alcohol per  week . She reports that she does not use drugs.  Allergies:  Allergies  Allergen Reactions  . Codeine Nausea Only    "clammy feeling"    Medications: I have reviewed the patient's current medications.  Results for orders placed or performed during the hospital encounter of 10/03/16 (from the past 48 hour(s))  Lactic acid, plasma     Status: Abnormal   Collection Time: 10/03/16  6:17 AM  Result Value Ref Range   Lactic Acid, Venous 6.0 (HH) 0.5 - 1.9 mmol/L    Comment: CRITICAL RESULT CALLED TO, READ BACK BY AND VERIFIED WITH KAILEY WALKER_0  ON 10/03/16 BY HKP   Comprehensive metabolic panel     Status: Abnormal   Collection Time: 10/03/16  6:17 AM  Result Value Ref Range   Sodium 135 135 - 145 mmol/L   Potassium 3.0 (L) 3.5 - 5.1 mmol/L   Chloride 105 101 - 111 mmol/L   CO2 18 (L) 22 - 32 mmol/L   Glucose, Bld 197 (H) 65 - 99 mg/dL   BUN 11 6 - 20 mg/dL   Creatinine, Ser 1.01 (H) 0.44 - 1.00 mg/dL   Calcium 8.5 (L) 8.9 - 10.3 mg/dL   Total Protein 6.5 6.5 - 8.1 g/dL   Albumin 3.3 (L) 3.5 - 5.0 g/dL   AST 49 (H) 15 - 41 U/L   ALT 1,282 (H) 14 -  54 U/L    Comment: RESULT CONFIRMED BY MANUAL DILUTION/HKP   Alkaline Phosphatase 87 38 - 126 U/L   Total Bilirubin 1.0 0.3 - 1.2 mg/dL   GFR calc non Af Amer 55 (L) >60 mL/min   GFR calc Af Amer >60 >60 mL/min    Comment: (NOTE) The eGFR has been calculated using the CKD EPI equation. This calculation has not been validated in all clinical situations. eGFR's persistently <60 mL/min signify possible Chronic Kidney Disease.    Anion gap 12 5 - 15  Lipase, blood     Status: None   Collection Time: 10/03/16  6:17 AM  Result Value Ref Range   Lipase 21 11 - 51 U/L  Troponin I     Status: None   Collection Time: 10/03/16  6:17 AM  Result Value Ref Range   Troponin I <0.03 <0.03 ng/mL  CBC WITH DIFFERENTIAL     Status: Abnormal   Collection Time: 10/03/16  6:17 AM  Result Value Ref Range   WBC 9.2 3.6 - 11.0 K/uL   RBC  2.88 (L) 3.80 - 5.20 MIL/uL   Hemoglobin 6.2 (L) 12.0 - 16.0 g/dL   HCT 20.7 (L) 35.0 - 47.0 %   MCV 71.9 (L) 80.0 - 100.0 fL   MCH 21.5 (L) 26.0 - 34.0 pg   MCHC 29.9 (L) 32.0 - 36.0 g/dL   RDW 18.9 (H) 11.5 - 14.5 %   Platelets 261 150 - 440 K/uL   Neutrophils Relative % 94 %   Neutro Abs 8.6 (H) 1.4 - 6.5 K/uL   Lymphocytes Relative 3 %   Lymphs Abs 0.3 (L) 1.0 - 3.6 K/uL   Monocytes Relative 3 %   Monocytes Absolute 0.3 0.2 - 0.9 K/uL   Eosinophils Relative 0 %   Eosinophils Absolute 0.0 0 - 0.7 K/uL   Basophils Relative 0 %   Basophils Absolute 0.0 0 - 0.1 K/uL  APTT     Status: None   Collection Time: 10/03/16  6:17 AM  Result Value Ref Range   aPTT 31 24 - 36 seconds  Protime-INR     Status: Abnormal   Collection Time: 10/03/16  6:17 AM  Result Value Ref Range   Prothrombin Time 15.8 (H) 11.4 - 15.2 seconds   INR 1.25   Urinalysis, Complete w Microscopic     Status: Abnormal   Collection Time: 10/03/16  6:17 AM  Result Value Ref Range   Color, Urine YELLOW (A) YELLOW   APPearance HAZY (A) CLEAR   Specific Gravity, Urine 1.015 1.005 - 1.030   pH 5.0 5.0 - 8.0   Glucose, UA >=500 (A) NEGATIVE mg/dL   Hgb urine dipstick NEGATIVE NEGATIVE   Bilirubin Urine NEGATIVE NEGATIVE   Ketones, ur 5 (A) NEGATIVE mg/dL   Protein, ur 100 (A) NEGATIVE mg/dL   Nitrite NEGATIVE NEGATIVE   Leukocytes, UA NEGATIVE NEGATIVE   RBC / HPF 0-5 0 - 5 RBC/hpf   WBC, UA 6-30 0 - 5 WBC/hpf   Bacteria, UA NONE SEEN NONE SEEN   Squamous Epithelial / LPF 0-5 (A) NONE SEEN   Mucous PRESENT    Amorphous Crystal PRESENT   ABO/Rh     Status: None   Collection Time: 10/03/16  6:17 AM  Result Value Ref Range   ABO/RH(D) A POS   Type and screen Lea     Status: None (Preliminary result)   Collection Time: 10/03/16  6:44 AM  Result Value  Ref Range   ABO/RH(D) A POS    Antibody Screen NEG    Sample Expiration 10/06/2016    Unit Number [id number]    Blood  Component Type RED CELLS,LR    Unit division 00    Status of Unit REL FROM [facility]    Transfusion Status OK TO TRANSFUSE    Crossmatch Result Compatible    Unit Number [id number]    Blood Component Type RED CELLS,LR    Unit division 00    Status of Unit REL FROM [facility]    Transfusion Status OK TO TRANSFUSE    Crossmatch Result Compatible    Unit Number [id number]    Blood Component Type RBC LR PHER2    Unit division 00    Status of Unit ALLOCATED    Transfusion Status OK TO TRANSFUSE    Crossmatch Result Compatible    Unit Number [id number]    Blood Component Type RED CELLS,LR    Unit division 00    Status of Unit ISSUED    Transfusion Status OK TO TRANSFUSE    Crossmatch Result Compatible   Prepare RBC     Status: None   Collection Time: 10/03/16  7:30 AM  Result Value Ref Range   Order Confirmation ORDER PROCESSED BY BLOOD BANK   Lactic acid, plasma     Status: Abnormal   Collection Time: 10/03/16  9:52 AM  Result Value Ref Range   Lactic Acid, Venous 2.1 (HH) 0.5 - 1.9 mmol/L    Comment: CRITICAL RESULT CALLED TO, READ BACK BY AND VERIFIED WITH LAURA CATES_0  ON 10/03/16 BY HKP   Procalcitonin - Baseline     Status: None   Collection Time: 10/03/16  9:52 AM  Result Value Ref Range   Procalcitonin 14.61 ng/mL    Comment:        Interpretation: PCT >= 10 ng/mL: Important systemic inflammatory response, almost exclusively due to severe bacterial sepsis or septic shock. (NOTE)         ICU PCT Algorithm               Non ICU PCT Algorithm    ----------------------------     ------------------------------         PCT < 0.25 ng/mL                 PCT < 0.1 ng/mL     Stopping of antibiotics            Stopping of antibiotics       strongly encouraged.               strongly encouraged.    ----------------------------     ------------------------------       PCT level decrease by               PCT < 0.25 ng/mL       >= 80% from peak PCT       OR PCT 0.25 - 0.5  ng/mL          Stopping of antibiotics                                             encouraged.     Stopping of antibiotics           encouraged.    ----------------------------     ------------------------------  PCT level decrease by              PCT >= 0.25 ng/mL       < 80% from peak PCT        AND PCT >= 0.5 ng/mL             Continuing antibiotics                                              encouraged.       Continuing antibiotics            encouraged.    ----------------------------     ------------------------------     PCT level increase compared          PCT > 0.5 ng/mL         with peak PCT AND          PCT >= 0.5 ng/mL             Escalation of antibiotics                                          strongly encouraged.      Escalation of antibiotics        strongly encouraged.   Hemoglobin and hematocrit, blood     Status: Abnormal   Collection Time: 10/03/16  9:52 AM  Result Value Ref Range   Hemoglobin 5.5 (L) 12.0 - 16.0 g/dL   HCT 18.8 (L) 35.0 - 47.0 %    Ct Abdomen Pelvis W Contrast  Result Date: 10/03/2016 CLINICAL DATA:  Right lower quadrant for the past 24 hours. EXAM: CT ABDOMEN AND PELVIS WITH CONTRAST TECHNIQUE: Multidetector CT imaging of the abdomen and pelvis was performed using the standard protocol following bolus administration of intravenous contrast. CONTRAST:  161m ISOVUE-300 IOPAMIDOL (ISOVUE-300) INJECTION 61% COMPARISON:  None. FINDINGS: Lower chest: No acute abnormality. Hepatobiliary: No focal liver abnormality is seen. Cholelithiasis. Mildly distended gallbladder. Pancreas: Unremarkable. No pancreatic ductal dilatation or surrounding inflammatory changes. Spleen: Normal in size without focal abnormality. Adrenals/Urinary Tract: Normal adrenal glands. Mild right hydroureteronephrosis with perinephric stranding. 1 mm calcification in the right lower pelvis which may reflect a distal ureteral calculus, but evaluation is limited secondary to beam  hardening artifact resulting from the right hip arthroplasty. Bladder is unremarkable. Stomach/Bowel: Stomach is within normal limits. No evidence of bowel wall thickening, distention, or inflammatory changes. Vascular/Lymphatic: Normal caliber abdominal aorta. No lymphadenopathy. Reproductive: Status post hysterectomy. No adnexal masses. Other: No abdominal wall hernia or abnormality. No abdominopelvic ascites. Musculoskeletal: No acute osseous abnormality. Right hip arthroplasty with beam hardening artifact partially obscuring the lower pelvis. IMPRESSION: 1. Mild right hydroureteronephrosis with perinephric stranding. 1 mm calcification in the right lower pelvis which may reflect a distal ureteral calculus, but evaluation is limited secondary to beam hardening artifact resulting from the right hip arthroplasty. Alternatively, pyelonephritis can have a similar appearance. Correlate with urinalysis. 2. Cholelithiasis. Electronically Signed   By: HKathreen Devoid  On: 10/03/2016 09:21   Dg Chest Port 1 View  Result Date: 10/03/2016 CLINICAL DATA:  Fever and chills for 1 day.  Nonproductive cough. EXAM: PORTABLE CHEST 1 VIEW COMPARISON:  None. FINDINGS: Probable hiatal hernia. Mild linear left lung base opacities may  be atelectatic. Right lung is clear. Pulmonary vasculature is normal. No pleural effusion. Normal hilar and mediastinal contours. IMPRESSION: Probable hiatal hernia. Mild atelectatic appearing left lung base opacities. Electronically Signed   By: Andreas Newport M.D.   On: 10/03/2016 06:50    Review of Systems  Constitutional: Positive for chills, fever and malaise/fatigue.  HENT: Negative.   Eyes: Negative.   Respiratory: Negative.   Cardiovascular: Negative.   Gastrointestinal: Negative.   Genitourinary: Positive for dysuria and urgency.  Musculoskeletal: Positive for myalgias.  Skin: Negative.    Blood pressure 134/63, pulse 84, temperature 98.7 F (37.1 C), temperature source Oral,  resp. rate 14, height _0  (1.626 m), weight 88.7 kg (195 lb 8.8 oz), SpO2 100 %. Physical Exam  Constitutional: She appears well-developed.  Husband at bedside  HENT:  Head: Normocephalic.  Eyes: Pupils are equal, round, and reactive to light.  Neck: Normal range of motion.  Cardiovascular: Normal rate.   Respiratory: Effort normal.  GI: Soft.  Genitourinary:  Genitourinary Comments: Mild Rt CVAT at present  Musculoskeletal: Normal range of motion.  Neurological: She is alert.  Skin: Skin is warm.  Psychiatric: She has a normal mood and affect.    Assessment/Plan:   1 - Right Hydronephrosis / Possible Small Ureteral Stone - DDX small ureteral stone v. Mild hydro from pyelo only v. Distal sricture from prior GYN surgery.   Discussed rec of cysto / Rt stent today with goal of renal decompression, then right ureteroscopy in 2-3 weeks in elective setting after clears infectious parameters. Risks, benefits, alternatives, expected peri-op course and need for staged approach discussed in detail.  2 - Suspect Pyelonephritis - agree with current ABX pending further CX data.   Anemia and transaminitis also quite concerning and likely unrelated to renal process. Agree with hospitalist admission and comangement as she will likely need additional evaluation of these medical confounders in house.    Roux Brandy 10/03/2016, 12:22 PM

## 2016-10-18 NOTE — Anesthesia Post-op Follow-up Note (Cosign Needed)
Anesthesia QCDR form completed.        

## 2016-10-18 NOTE — Op Note (Signed)
Date of procedure: 10/18/16  Preoperative diagnosis:  1. Right ureteral stone   Postoperative diagnosis:  1. Passed right ureteral stone   Procedure: 1. Cystoscopy 2. Right ureteroscopy 3. Right retrograde pyelogram with interpretation 4. Right ureteral stent placement 6 French by 26 cm with tether  Surgeon: Baruch Gouty, MD  Anesthesia: General  Complications: None  Intraoperative findings: The patient had no stone on pain ureteroscopy. Right retrograde pyelogram confirmed this with no filling defects. There was good drainage of contrast on postdrainage films.  EBL: None  Specimens: None  Drains: 6 French by 26 cm right double-J ureteral stent with tether  Disposition: Stable to the postanesthesia care unit  Indication for procedure: The patient is a 71 y.o. female with history of sepsis secondary to a distal 1 mm UVJ stone with subsequent hydroureteronephrosis status post right ureter stent placement who presents today for definitive surgical management.  After reviewing the management options for treatment, the patient elected to proceed with the above surgical procedure(s). We have discussed the potential benefits and risks of the procedure, side effects of the proposed treatment, the likelihood of the patient achieving the goals of the procedure, and any potential problems that might occur during the procedure or recuperation. Informed consent has been obtained.  Description of procedure: The patient was met in the preoperative area. All risks, benefits, and indications of the procedure were described in great detail. The patient consented to the procedure. Preoperative antibiotics were given. The patient was taken to the operative theater. General anesthesia was induced per the anesthesia service. The patient was then placed in the dorsal lithotomy position and prepped and draped in the usual sterile fashion. A preoperative timeout was called.   A 21 French 30 cystoscope  was inserted into the patient's bladder per urethra atraumatically. The right ureteral stent was grasped with flexible graspers and brought to level the urethral meatus. A sensor wire was exchanged to the right ureteral stent up to level of the right renal pelvis under fluoroscopy. The stent was removed. Semirigid ureteroscope was inserted in the patient's right ureteral orifice and advanced to the level of the renal pelvis with no sign of the stone. It does appear that the stone had passed due to its small size. Right retrograde powder was obtained which showed no filling defects in the renal pelvis and good postdrainage films. At this point, due to manipulation it was decided to leave a ureteral stent with a tether. The cystoscope was reassembled over the sensor wire. A 6 French by 26 cm double-J ureteral stent with a tether was placed. The sensor wire was removed. A curl seen in the patient's right renal pelvis on the fluoroscopy and in the urinary bladder under direct visualization. The cystoscope was disassembled, and the bladder drained. The disassembled scope was carefully removed leaving the tether behind. Fluoroscopy after the cystoscope was removed that showed a curl in both the bladder and the renal pelvis confirmed the stent did not move. The string was then tucked inside the patient's vagina. The patient was then woken from anesthesia and transferred in stable condition to the postanesthesia care unit.  Plan: The patient pole on her string in 3 days to remove her stent. She'll follow-up in one month with a renal ultrasound prior to rule out iatrogenic hydronephrosis.  Baruch Gouty, M.D.

## 2016-10-18 NOTE — Transfer of Care (Signed)
Immediate Anesthesia Transfer of Care Note  Patient: Tiffany Mcclain  Procedure(s) Performed: Procedure(s): CYSTOSCOPY WITH RETROGRADE PYELOGRAM (Right) URETEROSCOPY (Right) CYSTOSCOPY WITH STENT REPLACEMENT (Right)  Patient Location: PACU  Anesthesia Type:General  Level of Consciousness: sedated  Airway & Oxygen Therapy: Patient Spontanous Breathing and Patient connected to face mask oxygen  Post-op Assessment: Report given to RN and Post -op Vital signs reviewed and stable  Post vital signs: Reviewed and stable  Last Vitals:  Vitals:   10/18/16 0907 10/18/16 1050  BP: 128/80 123/66  Pulse: 98 80  Resp: 18 12  Temp: 36.7 C 79.0 C    Complications: No apparent anesthesia complications

## 2016-10-18 NOTE — Anesthesia Preprocedure Evaluation (Signed)
Anesthesia Evaluation  Patient identified by MRN, date of birth, ID band Patient awake    Reviewed: Allergy & Precautions, NPO status , Patient's Chart, lab work & pertinent test results  History of Anesthesia Complications (+) Family history of anesthesia reaction and history of anesthetic complications (Father with prolonged awakening)  Airway Mallampati: II       Dental   Pulmonary former smoker,           Cardiovascular negative cardio ROS       Neuro/Psych Anxiety Depression    GI/Hepatic Neg liver ROS, hiatal hernia, PUD, GERD  Medicated and Controlled,  Endo/Other  negative endocrine ROS  Renal/GU negative Renal ROS     Musculoskeletal   Abdominal   Peds  Hematology  (+) anemia ,   Anesthesia Other Findings   Reproductive/Obstetrics                             Anesthesia Physical Anesthesia Plan  ASA: II  Anesthesia Plan: General   Post-op Pain Management:    Induction: Intravenous  Airway Management Planned: LMA  Additional Equipment:   Intra-op Plan:   Post-operative Plan:   Informed Consent: I have reviewed the patients History and Physical, chart, labs and discussed the procedure including the risks, benefits and alternatives for the proposed anesthesia with the patient or authorized representative who has indicated his/her understanding and acceptance.     Plan Discussed with:   Anesthesia Plan Comments:         Anesthesia Quick Evaluation

## 2016-10-18 NOTE — Telephone Encounter (Signed)
-----   Message from Nickie Retort, MD sent at 10/18/2016 10:54 AM EDT ----- Patient needs to see me in one month with renal u/s prior.

## 2016-10-18 NOTE — Anesthesia Postprocedure Evaluation (Signed)
Anesthesia Post Note  Patient: Kandy Rumore  Procedure(s) Performed: Procedure(s) (LRB): CYSTOSCOPY WITH RETROGRADE PYELOGRAM (Right) URETEROSCOPY (Right) CYSTOSCOPY WITH STENT REPLACEMENT (Right)  Patient location during evaluation: PACU Anesthesia Type: General Level of consciousness: awake and alert Pain management: pain level controlled Vital Signs Assessment: post-procedure vital signs reviewed and stable Respiratory status: spontaneous breathing and respiratory function stable Cardiovascular status: stable Anesthetic complications: no     Last Vitals:  Vitals:   10/18/16 1105 10/18/16 1120  BP: 131/79 132/77  Pulse: 83 80  Resp: 12 14  Temp:      Last Pain:  Vitals:   10/18/16 1050  TempSrc:   PainSc: Asleep                 Bianney Rockwood K

## 2016-10-23 ENCOUNTER — Other Ambulatory Visit: Payer: Self-pay

## 2016-10-23 ENCOUNTER — Ambulatory Visit (INDEPENDENT_AMBULATORY_CARE_PROVIDER_SITE_OTHER): Payer: Medicare Other | Admitting: Gastroenterology

## 2016-10-23 ENCOUNTER — Encounter: Payer: Self-pay | Admitting: Gastroenterology

## 2016-10-23 VITALS — BP 131/77 | HR 103 | Temp 98.3°F | Ht 64.0 in | Wt 185.0 lb

## 2016-10-23 DIAGNOSIS — D500 Iron deficiency anemia secondary to blood loss (chronic): Secondary | ICD-10-CM

## 2016-10-23 DIAGNOSIS — D509 Iron deficiency anemia, unspecified: Secondary | ICD-10-CM

## 2016-10-23 NOTE — Progress Notes (Signed)
Primary Care Physician: Albina Billet, MD  Primary Gastroenterologist:  Dr. Lucilla Lame  Chief Complaint  Patient presents with  . Hospitalization Follow-up    HPI: Tiffany Mcclain is a 71 y.o. female here for follow-up after being in the hospital for anemia.  The patient was found to have a large hiatal hernia and some gastric erosions.  The patient had her most recent blood work showing her hemoglobin to be 11.8.  The patient denies any sign of further GI bleeding.  Current Outpatient Prescriptions  Medication Sig Dispense Refill  . acetaminophen (TYLENOL) 650 MG CR tablet Take 650 mg by mouth every 8 (eight) hours as needed for pain.    Marland Kitchen escitalopram (LEXAPRO) 20 MG tablet Take 10-20 mg by mouth every morning. Alternates and takes 0.5 tablet 1 day and 1 tablet the next    . ferrous sulfate 325 (65 FE) MG tablet Take 1 tablet (325 mg total) by mouth daily with breakfast. 60 tablet 0  . fluticasone (FLONASE) 50 MCG/ACT nasal spray Place 2 sprays into both nostrils daily.    Marland Kitchen loratadine (CLARITIN) 10 MG tablet Take 10 mg by mouth daily.    . pantoprazole (PROTONIX) 40 MG tablet Take 1 tablet (40 mg total) by mouth 2 (two) times daily before a meal. 60 tablet 0  . SUMAtriptan (IMITREX) 100 MG tablet Take 100 mg by mouth every 2 (two) hours as needed for migraine. May repeat in 2 hours if headache persists or recurs.    Marland Kitchen HYDROcodone-acetaminophen (NORCO) 5-325 MG tablet Take 1 tablet by mouth every 4 (four) hours as needed for moderate pain. (Patient not taking: Reported on 10/23/2016) 30 tablet 0  . Multiple Vitamins-Minerals (PRESERVISION AREDS 2 PO) Take 1 tablet by mouth 2 (two) times daily.    Marland Kitchen sulfamethoxazole-trimethoprim (BACTRIM DS,SEPTRA DS) 800-160 MG tablet Take 1 tablet by mouth 2 (two) times daily. (Patient not taking: Reported on 10/23/2016) 6 tablet 0   No current facility-administered medications for this visit.     Allergies as of 10/23/2016 - Review Complete  10/23/2016  Allergen Reaction Noted  . Codeine Nausea Only 04/27/2014  . Other Diarrhea 10/11/2016    ROS:  General: Negative for anorexia, weight loss, fever, chills, fatigue, weakness. ENT: Negative for hoarseness, difficulty swallowing , nasal congestion. CV: Negative for chest pain, angina, palpitations, dyspnea on exertion, peripheral edema.  Respiratory: Negative for dyspnea at rest, dyspnea on exertion, cough, sputum, wheezing.  GI: See history of present illness. GU:  Negative for dysuria, hematuria, urinary incontinence, urinary frequency, nocturnal urination.  Endo: Negative for unusual weight change.    Physical Examination:   BP 131/77   Pulse (!) 103   Temp 98.3 F (36.8 C) (Oral)   Ht 5\' 4"  (1.626 m)   Wt 185 lb (83.9 kg)   BMI 31.76 kg/m   General: Well-nourished, well-developed in no acute distress.  Eyes: No icterus. Conjunctivae pink. Mouth: Oropharyngeal mucosa moist and pink , no lesions erythema or exudate. Lungs: Clear to auscultation bilaterally. Non-labored. Heart: Regular rate and rhythm, no murmurs rubs or gallops.  Abdomen: Bowel sounds are normal, nontender, nondistended, no hepatosplenomegaly or masses, no abdominal bruits or hernia , no rebound or guarding.   Extremities: No lower extremity edema. No clubbing or deformities. Neuro: Alert and oriented x 3.  Grossly intact. Skin: Warm and dry, no jaundice.   Psych: Alert and cooperative, normal mood and affect.  Labs:    Imaging Studies: Ct Abdomen Pelvis  W Contrast  Result Date: 10/03/2016 CLINICAL DATA:  Right lower quadrant for the past 24 hours. EXAM: CT ABDOMEN AND PELVIS WITH CONTRAST TECHNIQUE: Multidetector CT imaging of the abdomen and pelvis was performed using the standard protocol following bolus administration of intravenous contrast. CONTRAST:  173mL ISOVUE-300 IOPAMIDOL (ISOVUE-300) INJECTION 61% COMPARISON:  None. FINDINGS: Lower chest: No acute abnormality. Hepatobiliary: No focal  liver abnormality is seen. Cholelithiasis. Mildly distended gallbladder. Pancreas: Unremarkable. No pancreatic ductal dilatation or surrounding inflammatory changes. Spleen: Normal in size without focal abnormality. Adrenals/Urinary Tract: Normal adrenal glands. Mild right hydroureteronephrosis with perinephric stranding. 1 mm calcification in the right lower pelvis which may reflect a distal ureteral calculus, but evaluation is limited secondary to beam hardening artifact resulting from the right hip arthroplasty. Bladder is unremarkable. Stomach/Bowel: Stomach is within normal limits. No evidence of bowel wall thickening, distention, or inflammatory changes. Vascular/Lymphatic: Normal caliber abdominal aorta. No lymphadenopathy. Reproductive: Status post hysterectomy. No adnexal masses. Other: No abdominal wall hernia or abnormality. No abdominopelvic ascites. Musculoskeletal: No acute osseous abnormality. Right hip arthroplasty with beam hardening artifact partially obscuring the lower pelvis. IMPRESSION: 1. Mild right hydroureteronephrosis with perinephric stranding. 1 mm calcification in the right lower pelvis which may reflect a distal ureteral calculus, but evaluation is limited secondary to beam hardening artifact resulting from the right hip arthroplasty. Alternatively, pyelonephritis can have a similar appearance. Correlate with urinalysis. 2. Cholelithiasis. Electronically Signed   By: Kathreen Devoid   On: 10/03/2016 09:21   Dg Chest Port 1 View  Result Date: 10/03/2016 CLINICAL DATA:  Fever and chills for 1 day.  Nonproductive cough. EXAM: PORTABLE CHEST 1 VIEW COMPARISON:  None. FINDINGS: Probable hiatal hernia. Mild linear left lung base opacities may be atelectatic. Right lung is clear. Pulmonary vasculature is normal. No pleural effusion. Normal hilar and mediastinal contours. IMPRESSION: Probable hiatal hernia. Mild atelectatic appearing left lung base opacities. Electronically Signed   By: Andreas Newport M.D.   On: 10/03/2016 06:50    Assessment and Plan:   Tiffany Mcclain is a 71 y.o. y/o female Who was in the hospital for anemia with a upper endoscopy showing some erosions.  The patient will be set up for a CBC and she will also be set up for a  Colonoscopy. I have discussed risks & benefits which include, but are not limited to, bleeding, infection, perforation & drug reaction.  The patient agrees with this plan & written consent will be obtained.       Lucilla Lame, MD. Marval Regal   Note: This dictation was prepared with Dragon dictation along with smaller phrase technology. Any transcriptional errors that result from this process are unintentional.

## 2016-10-24 LAB — CBC WITH DIFFERENTIAL/PLATELET
Basophils Absolute: 0 10*3/uL (ref 0.0–0.2)
Basos: 1 %
EOS (ABSOLUTE): 0.3 10*3/uL (ref 0.0–0.4)
EOS: 6 %
HEMATOCRIT: 39.1 % (ref 34.0–46.6)
HEMOGLOBIN: 12 g/dL (ref 11.1–15.9)
IMMATURE GRANULOCYTES: 0 %
Immature Grans (Abs): 0 10*3/uL (ref 0.0–0.1)
LYMPHS: 23 %
Lymphocytes Absolute: 1.3 10*3/uL (ref 0.7–3.1)
MCH: 25.1 pg — ABNORMAL LOW (ref 26.6–33.0)
MCHC: 30.7 g/dL — AB (ref 31.5–35.7)
MCV: 82 fL (ref 79–97)
Monocytes Absolute: 0.5 10*3/uL (ref 0.1–0.9)
Monocytes: 8 %
NEUTROS PCT: 62 %
Neutrophils Absolute: 3.4 10*3/uL (ref 1.4–7.0)
Platelets: 353 10*3/uL (ref 150–379)
RBC: 4.79 x10E6/uL (ref 3.77–5.28)
RDW: 24.2 % — AB (ref 12.3–15.4)
WBC: 5.5 10*3/uL (ref 3.4–10.8)

## 2016-10-26 ENCOUNTER — Telehealth: Payer: Self-pay

## 2016-10-26 NOTE — Telephone Encounter (Signed)
-----   Message from Lucilla Lame, MD sent at 10/24/2016  8:02 PM EDT ----- That the patient know that her blood count is now normal.

## 2016-10-26 NOTE — Telephone Encounter (Signed)
Pt notified of lab results

## 2016-11-02 ENCOUNTER — Telehealth: Payer: Self-pay | Admitting: Gastroenterology

## 2016-11-02 NOTE — Telephone Encounter (Signed)
11/02/16 [employer] website for Colonoscopy 62563 / D50.9 NO prior auth required Decision ID#: [id number].

## 2016-11-14 ENCOUNTER — Encounter: Payer: Self-pay | Admitting: *Deleted

## 2016-11-17 ENCOUNTER — Ambulatory Visit
Admission: RE | Admit: 2016-11-17 | Discharge: 2016-11-17 | Disposition: A | Discharge status: Home / Self Care | Payer: Medicare Other | Source: Ambulatory Visit | Attending: Urology | Admitting: Urology

## 2016-11-17 DIAGNOSIS — N9489 Other specified conditions associated with female genital organs and menstrual cycle: Secondary | ICD-10-CM | POA: Insufficient documentation

## 2016-11-17 DIAGNOSIS — N200 Calculus of kidney: Secondary | ICD-10-CM | POA: Insufficient documentation

## 2016-11-17 DIAGNOSIS — N1339 Other hydronephrosis: Secondary | ICD-10-CM | POA: Insufficient documentation

## 2016-11-17 NOTE — Discharge Instructions (Signed)
General Anesthesia, Adult, Care After °These instructions provide you with information about caring for yourself after your procedure. Your health care provider may also give you more specific instructions. Your treatment has been planned according to current medical practices, but problems sometimes occur. Call your health care provider if you have any problems or questions after your procedure. °What can I expect after the procedure? °After the procedure, it is common to have: °· Vomiting. °· A sore throat. °· Mental slowness. ° °It is common to feel: °· Nauseous. °· Cold or shivery. °· Sleepy. °· Tired. °· Sore or achy, even in parts of your body where you did not have surgery. ° °Follow these instructions at home: °For at least 24 hours after the procedure: °· Do not: °? Participate in activities where you could fall or become injured. °? Drive. °? Use heavy machinery. °? Drink alcohol. °? Take sleeping pills or medicines that cause drowsiness. °? Make important decisions or sign legal documents. °? Take care of children on your own. °· Rest. °Eating and drinking °· If you vomit, drink water, juice, or soup when you can drink without vomiting. °· Drink enough fluid to keep your urine clear or pale yellow. °· Make sure you have little or no nausea before eating solid foods. °· Follow the diet recommended by your health care provider. °General instructions °· Have a responsible adult stay with you until you are awake and alert. °· Return to your normal activities as told by your health care provider. Ask your health care provider what activities are safe for you. °· Take over-the-counter and prescription medicines only as told by your health care provider. °· If you smoke, do not smoke without supervision. °· Keep all follow-up visits as told by your health care provider. This is important. °Contact a health care provider if: °· You continue to have nausea or vomiting at home, and medicines are not helpful. °· You  cannot drink fluids or start eating again. °· You cannot urinate after 8-12 hours. °· You develop a skin rash. °· You have fever. °· You have increasing redness at the site of your procedure. °Get help right away if: °· You have difficulty breathing. °· You have chest pain. °· You have unexpected bleeding. °· You feel that you are having a life-threatening or urgent problem. °This information is not intended to replace advice given to you by your health care provider. Make sure you discuss any questions you have with your health care provider. °Document Released: 08/28/2000 Document Revised: 10/25/2015 Document Reviewed: 05/06/2015 °Elsevier Interactive Patient Education © 2018 [employer]vier Inc. ° °

## 2016-11-20 ENCOUNTER — Encounter
Admission: RE | Disposition: A | Discharge status: Home / Self Care | Payer: Self-pay | Source: Ambulatory Visit | Attending: Gastroenterology

## 2016-11-20 ENCOUNTER — Ambulatory Visit: Payer: Medicare Other | Admitting: Anesthesiology

## 2016-11-20 ENCOUNTER — Ambulatory Visit
Admission: RE | Admit: 2016-11-20 | Discharge: 2016-11-20 | Disposition: A | Discharge status: Home / Self Care | Payer: Medicare Other | Source: Ambulatory Visit | Attending: Gastroenterology | Admitting: Gastroenterology

## 2016-11-20 DIAGNOSIS — Z96641 Presence of right artificial hip joint: Secondary | ICD-10-CM | POA: Diagnosis not present

## 2016-11-20 DIAGNOSIS — D128 Benign neoplasm of rectum: Secondary | ICD-10-CM | POA: Diagnosis not present

## 2016-11-20 DIAGNOSIS — K621 Rectal polyp: Secondary | ICD-10-CM

## 2016-11-20 DIAGNOSIS — F419 Anxiety disorder, unspecified: Secondary | ICD-10-CM | POA: Diagnosis not present

## 2016-11-20 DIAGNOSIS — Z79899 Other long term (current) drug therapy: Secondary | ICD-10-CM | POA: Diagnosis not present

## 2016-11-20 DIAGNOSIS — K5730 Diverticulosis of large intestine without perforation or abscess without bleeding: Secondary | ICD-10-CM | POA: Insufficient documentation

## 2016-11-20 DIAGNOSIS — D509 Iron deficiency anemia, unspecified: Secondary | ICD-10-CM | POA: Diagnosis not present

## 2016-11-20 DIAGNOSIS — F329 Major depressive disorder, single episode, unspecified: Secondary | ICD-10-CM | POA: Insufficient documentation

## 2016-11-20 DIAGNOSIS — Z7951 Long term (current) use of inhaled steroids: Secondary | ICD-10-CM | POA: Diagnosis not present

## 2016-11-20 DIAGNOSIS — D120 Benign neoplasm of cecum: Secondary | ICD-10-CM

## 2016-11-20 DIAGNOSIS — Z87891 Personal history of nicotine dependence: Secondary | ICD-10-CM | POA: Diagnosis not present

## 2016-11-20 DIAGNOSIS — K219 Gastro-esophageal reflux disease without esophagitis: Secondary | ICD-10-CM | POA: Diagnosis not present

## 2016-11-20 HISTORY — PX: POLYPECTOMY: SHX5525

## 2016-11-20 HISTORY — PX: COLONOSCOPY WITH PROPOFOL: SHX5780

## 2016-11-20 SURGERY — COLONOSCOPY WITH PROPOFOL
Anesthesia: General | Wound class: Contaminated

## 2016-11-20 MED ORDER — STERILE WATER FOR IRRIGATION IR SOLN
Status: DC | PRN
  Administered 2016-11-20: 10:00:00

## 2016-11-20 MED ORDER — PROPOFOL 10 MG/ML IV BOLUS
INTRAVENOUS | Status: DC | PRN
  Administered 2016-11-20 (×2): 50 mg via INTRAVENOUS
  Administered 2016-11-20: 20 mg via INTRAVENOUS
  Administered 2016-11-20: 100 mg via INTRAVENOUS
  Administered 2016-11-20: 20 mg via INTRAVENOUS

## 2016-11-20 MED ORDER — OXYCODONE HCL 5 MG/5ML PO SOLN: 5.0000 mg | Freq: Once | ORAL | Status: DC | PRN

## 2016-11-20 MED ORDER — LIDOCAINE HCL (CARDIAC) 20 MG/ML IV SOLN
INTRAVENOUS | Status: DC | PRN
  Administered 2016-11-20: 50 mg via INTRAVENOUS

## 2016-11-20 MED ORDER — LACTATED RINGERS IV SOLN
INTRAVENOUS | Status: DC
  Administered 2016-11-20: 10:00:00 via INTRAVENOUS

## 2016-11-20 MED ORDER — OXYCODONE HCL 5 MG PO TABS: 5.0000 mg | ORAL_TABLET | Freq: Once | ORAL | Status: DC | PRN

## 2016-11-20 SURGICAL SUPPLY — 23 items
CANISTER SUCT 1200ML W/VALVE (MISCELLANEOUS) ×4 IMPLANT
CLIP HMST 235XBRD CATH ROT (MISCELLANEOUS) ×2 IMPLANT
CLIP RESOLUTION 360 11X235 (MISCELLANEOUS) ×2
FCP ESCP3.2XJMB 240X2.8X (MISCELLANEOUS)
FORCEPS BIOP RAD 4 LRG CAP 4 (CUTTING FORCEPS) IMPLANT
FORCEPS BIOP RJ4 240 W/NDL (MISCELLANEOUS)
FORCEPS ESCP3.2XJMB 240X2.8X (MISCELLANEOUS) IMPLANT
GOWN CVR UNV OPN BCK APRN NK (MISCELLANEOUS) ×4 IMPLANT
GOWN ISOL THUMB LOOP REG UNIV (MISCELLANEOUS) ×4
INJECTOR VARIJECT VIN23 (MISCELLANEOUS) IMPLANT
KIT DEFENDO VALVE AND CONN (KITS) IMPLANT
KIT ENDO PROCEDURE OLY (KITS) ×4 IMPLANT
MARKER SPOT ENDO TATTOO 5ML (MISCELLANEOUS) IMPLANT
PAD GROUND ADULT SPLIT (MISCELLANEOUS) IMPLANT
PROBE APC STR FIRE (PROBE) IMPLANT
RETRIEVER NET ROTH 2.5X230 LF (MISCELLANEOUS) IMPLANT
SNARE SHORT THROW 13M SML OVAL (MISCELLANEOUS) ×4 IMPLANT
SNARE SHORT THROW 30M LRG OVAL (MISCELLANEOUS) IMPLANT
SNARE SNG USE RND 15MM (INSTRUMENTS) IMPLANT
SPOT EX ENDOSCOPIC TATTOO (MISCELLANEOUS)
TRAP ETRAP POLY (MISCELLANEOUS) ×4 IMPLANT
VARIJECT INJECTOR VIN23 (MISCELLANEOUS)
WATER STERILE IRR 250ML POUR (IV SOLUTION) ×4 IMPLANT

## 2016-11-20 NOTE — Op Note (Signed)
[facility] Gastroenterology Patient Name: Tiffany Mcclain Procedure Date: 11/20/2016 10:05 AM MRN: [id number] Account #: [id number] Date of Birth: Aug 17, 1945 Admit Type: Outpatient Age: 71 Room: [facility] OR ROOM 01 Gender: Female Note Status: Finalized Procedure:            Colonoscopy Indications:          Iron deficiency anemia Providers:            Lucilla Lame MD, MD Referring MD:         Leona Carry. Hall Busing, MD (Referring MD) Medicines:            Propofol per Anesthesia Complications:        No immediate complications. Procedure:            Pre-Anesthesia Assessment:                       - Prior to the procedure, a History and Physical was                        performed, and patient medications and allergies were                        reviewed. The patient's tolerance of previous                        anesthesia was also reviewed. The risks and benefits of                        the procedure and the sedation options and risks were                        discussed with the patient. All questions were                        answered, and informed consent was obtained. Prior                        Anticoagulants: The patient has taken no previous                        anticoagulant or antiplatelet agents. ASA Grade                        Assessment: II - A patient with mild systemic disease.                        After reviewing the risks and benefits, the patient was                        deemed in satisfactory condition to undergo the                        procedure.                       After obtaining informed consent, the colonoscope was                        passed under direct vision. Throughout the procedure,  the patient's blood pressure, pulse, and oxygen                        saturations were monitored continuously. The Gibson [id number]) was introduced through the       anus and advanced to the the cecum, identified by                        appendiceal orifice and ileocecal valve. The                        colonoscopy was performed without difficulty. The                        patient tolerated the procedure well. The quality of                        the bowel preparation was adequate. Findings:      The perianal and digital rectal examinations were normal.      A 9 mm polyp was found in the cecum. The polyp was sessile. The polyp       was removed with a cold snare. Resection and retrieval were complete. To       prevent bleeding post-intervention, two hemostatic clips were       successfully placed (MR conditional). There was no bleeding at the end       of the procedure.      A 4 mm polyp was found in the cecum. The polyp was sessile. The polyp       was removed with a cold snare. Resection and retrieval were complete.      A 7 mm polyp was found in the rectum. The polyp was pedunculated. The       polyp was removed with a cold snare. Resection and retrieval were       complete.      Multiple small-mouthed diverticula were found in the sigmoid colon. Impression:           - One 9 mm polyp in the cecum, removed with a cold                        snare. Resected and retrieved. Clips (MR conditional)                        were placed.                       - One 4 mm polyp in the cecum, removed with a cold                        snare. Resected and retrieved.                       - One 7 mm polyp in the rectum, removed with a cold                        snare. Resected and retrieved.                       -  Diverticulosis in the sigmoid colon. Recommendation:       - Discharge patient to home.                       - Resume previous diet.                       - Continue present medications.                       - Repeat colonoscopy in 5 years if polyp adenoma and 10                        years if hyperplastic Procedure Code(s):    ---  Professional ---                       [phone], Colonoscopy, flexible; with removal of tumor(s),                        polyp(s), or other lesion(s) by snare technique Diagnosis Code(s):    --- Professional ---                       D50.9, Iron deficiency anemia, unspecified                       D12.0, Benign neoplasm of cecum                       K62.1, Rectal polyp CPT copyright 2016 American Medical Association. All rights reserved. The codes documented in this report are preliminary and upon coder review may  be revised to meet current compliance requirements. Lucilla Lame MD, MD 11/20/2016 10:31:07 AM This report has been signed electronically. Number of Addenda: 0 Note Initiated On: 11/20/2016 10:05 AM Scope Withdrawal Time: 0 hours 10 minutes 55 seconds  Total Procedure Duration: 0 hours 15 minutes 3 seconds       [facility]

## 2016-11-20 NOTE — Transfer of Care (Signed)
Immediate Anesthesia Transfer of Care Note  Patient: Tiffany Mcclain  Procedure(s) Performed: Procedure(s): COLONOSCOPY WITH PROPOFOL (N/A) POLYPECTOMY  Patient Location: PACU  Anesthesia Type: General  Level of Consciousness: awake, alert  and patient cooperative  Airway and Oxygen Therapy: Patient Spontanous Breathing and Patient connected to supplemental oxygen  Post-op Assessment: Post-op Vital signs reviewed, Patient's Cardiovascular Status Stable, Respiratory Function Stable, Patent Airway and No signs of Nausea or vomiting  Post-op Vital Signs: Reviewed and stable  Complications: No apparent anesthesia complications

## 2016-11-20 NOTE — Anesthesia Preprocedure Evaluation (Signed)
Anesthesia Evaluation  Patient identified by MRN, date of birth, ID band Patient awake    Reviewed: Allergy & Precautions, H&P , NPO status , Patient's Chart, lab work & pertinent test results  Airway Mallampati: II  TM Distance: >3 FB Neck ROM: full    Dental no notable dental hx.    Pulmonary former smoker,    Pulmonary exam normal        Cardiovascular negative cardio ROS Normal cardiovascular exam     Neuro/Psych negative neurological ROS     GI/Hepatic Neg liver ROS, hiatal hernia, Medicated,  Endo/Other  negative endocrine ROS  Renal/GU negative Renal ROS     Musculoskeletal   Abdominal   Peds  Hematology   Anesthesia Other Findings   Reproductive/Obstetrics negative OB ROS                             Anesthesia Physical Anesthesia Plan  ASA: II  Anesthesia Plan: General   Post-op Pain Management:    Induction:   PONV Risk Score and Plan:   Airway Management Planned:   Additional Equipment:   Intra-op Plan:   Post-operative Plan:   Informed Consent: I have reviewed the patients History and Physical, chart, labs and discussed the procedure including the risks, benefits and alternatives for the proposed anesthesia with the patient or authorized representative who has indicated his/her understanding and acceptance.     Plan Discussed with:   Anesthesia Plan Comments:         Anesthesia Quick Evaluation

## 2016-11-20 NOTE — H&P (Signed)
Tiffany Lame, MD [address] [place], [place] [address] [phone] Fax : [phone]  Primary Care Physician:  Albina Billet, MD Primary Gastroenterologist:  Dr. Allen Norris  Pre-Procedure History & Physical: HPI:  Tiffany Mcclain is a 71 y.o. female is here for an colonoscopy.   Past Medical History:  Diagnosis Date  . Anemia   . Anxiety   . Arthritis   . Clubbed foot    left  . Depression   . Duodenal ulcer 10/2016  . Family history of adverse reaction to anesthesia    father combative/AMS after anethesia   . Fracture of right humerus    hx of   . GERD (gastroesophageal reflux disease)   . Headache    hx of migraines   . History of hiatal hernia   . History of kidney stones   . Shortness of breath dyspnea    due to anemia  . UTI (urinary tract infection)     Past Surgical History:  Procedure Laterality Date  . ABDOMINAL HYSTERECTOMY    . CYSTOSCOPY W/ RETROGRADES Right 10/18/2016   Procedure: CYSTOSCOPY WITH RETROGRADE PYELOGRAM;  Surgeon: Nickie Retort, MD;  Location: [facility] ORS;  Service: Urology;  Laterality: Right;  . CYSTOSCOPY W/ URETERAL STENT PLACEMENT Right 10/18/2016   Procedure: CYSTOSCOPY WITH STENT REPLACEMENT;  Surgeon: Nickie Retort, MD;  Location: [facility] ORS;  Service: Urology;  Laterality: Right;  . CYSTOSCOPY WITH STENT PLACEMENT Right 10/03/2016   Procedure: CYSTOSCOPY WITH STENT PLACEMENT;  Surgeon: Cleon Gustin, MD;  Location: [facility] ORS;  Service: Urology;  Laterality: Right;  . ESOPHAGOGASTRODUODENOSCOPY N/A 10/05/2016   Procedure: ESOPHAGOGASTRODUODENOSCOPY (EGD);  Surgeon: Tiffany Lame, MD;  Location: [facility] END[facility]COPY;  Service: Endoscopy;  Laterality: N/A;  . JOINT REPLACEMENT     Right hip  . 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 URETEROSCOPY Right  10/18/2016   Procedure: URETEROSCOPY;  Surgeon: Nickie Retort, MD;  Location: [facility] ORS;  Service: Urology;  Laterality: Right;    Prior to Admission medications   Medication Sig Start Date End Date Taking? Authorizing Provider  acetaminophen (TYLENOL) 650 MG CR tablet Take 650 mg by mouth every 8 (eight) hours as needed for pain.   Yes [provider]  escitalopram (LEXAPRO) 20 MG tablet Take 10-20 mg by mouth every morning. Alternates and takes 0.5 tablet 1 day and 1 tablet the next   Yes [provider]  ferrous sulfate 325 (65 FE) MG tablet Take 1 tablet (325 mg total) by mouth daily with breakfast. 10/05/16  Yes Sudini, Srikar, MD  fluticasone (FLONASE) 50 MCG/ACT nasal spray Place 2 sprays into both nostrils daily.   Yes [provider]  loratadine (CLARITIN) 10 MG tablet Take 10 mg by mouth daily.   Yes [provider]  HYDROcodone-acetaminophen (NORCO) 5-325 MG tablet Take 1 tablet by mouth every 4 (four) hours as needed for moderate pain. Patient not taking: Reported on 10/23/2016 10/18/16   Nickie Retort, MD  pantoprazole (PROTONIX) 40 MG tablet Take 1 tablet (40 mg total) by mouth 2 (two) times daily before a meal. Patient not taking: Reported on 11/14/2016 10/06/16   Hillary Bow, MD  SUMAtriptan (IMITREX) 100 MG tablet Take 100 mg by mouth every 2 (two) hours as needed for migraine. May repeat in 2 hours if headache persists  or recurs.    [provider]    Allergies as of 10/23/2016 - Review Complete 10/23/2016  Allergen Reaction Noted  . Codeine Nausea Only 04/27/2014  . Other Diarrhea 10/11/2016    Family History  Problem Relation Age of Onset  . Hypertension Mother   . CAD Neg Hx     Social History   Social History  . Marital status: Married    Spouse name: N/A  . Number of children: N/A  . Years of education: N/A   Occupational History  . Not on file.   Social History Main Topics  . Smoking status: Former  Smoker    Packs/day: 0.00    Types: Cigarettes  . Smokeless tobacco: Never Used     Comment: smoked as teenager - 1 yr  . Alcohol use 4.2 oz/week    7 Glasses of wine per week     Comment: wine daily  . Drug use: No  . Sexual activity: Not on file   Other Topics Concern  . Not on file   Social History Narrative  . No narrative on file    Review of Systems: See HPI, otherwise negative ROS  Physical Exam: BP 131/83   Pulse (!) 104   Temp 98.1 F (36.7 C) (Temporal)   Ht 5\' 4"  (1.626 m)   Wt 181 lb (82.1 kg)   SpO2 95%   BMI 31.07 kg/m  General:   Alert,  pleasant and cooperative in NAD Head:  Normocephalic and atraumatic. Neck:  Supple; no masses or thyromegaly. Lungs:  Clear throughout to auscultation.    Heart:  Regular rate and rhythm. Abdomen:  Soft, nontender and nondistended. Normal bowel sounds, without guarding, and without rebound.   Neurologic:  Alert and  oriented x4;  grossly normal neurologically.  Impression/Plan: Tiffany Mcclain is here for an colonoscopy to be performed for IDA  Risks, benefits, limitations, and alternatives regarding  colonoscopy have been reviewed with the patient.  Questions have been answered.  All parties agreeable.   Tiffany Lame, MD  11/20/2016, 9:36 AM

## 2016-11-20 NOTE — Anesthesia Procedure Notes (Signed)
Performed by: Lekeith Wulf Pre-anesthesia Checklist: Patient identified, Emergency Drugs available, Suction available, Timeout performed and Patient being monitored Patient Re-evaluated:Patient Re-evaluated prior to induction Oxygen Delivery Method: Nasal cannula Placement Confirmation: positive ETCO2       

## 2016-11-20 NOTE — Anesthesia Postprocedure Evaluation (Signed)
Anesthesia Post Note  Patient: Tiffany Mcclain  Procedure(s) Performed: Procedure(s) (LRB): COLONOSCOPY WITH PROPOFOL (N/A) POLYPECTOMY  Patient location during evaluation: PACU Anesthesia Type: General Level of consciousness: awake and alert Pain management: pain level controlled Vital Signs Assessment: post-procedure vital signs reviewed and stable Respiratory status: spontaneous breathing Cardiovascular status: blood pressure returned to baseline Postop Assessment: no headache Anesthetic complications: no    Jaci Standard, III,  Diyari Cherne D

## 2016-11-21 ENCOUNTER — Encounter: Payer: Self-pay | Admitting: Gastroenterology

## 2016-11-22 ENCOUNTER — Encounter: Payer: Self-pay | Admitting: Gastroenterology

## 2016-11-23 ENCOUNTER — Encounter: Payer: Self-pay | Admitting: Gastroenterology

## 2016-11-23 ENCOUNTER — Encounter: Payer: Self-pay | Admitting: Urology

## 2016-11-23 ENCOUNTER — Ambulatory Visit (INDEPENDENT_AMBULATORY_CARE_PROVIDER_SITE_OTHER): Payer: Medicare Other | Admitting: Urology

## 2016-11-23 VITALS — BP 126/80 | HR 96 | Ht 64.0 in | Wt 192.0 lb

## 2016-11-23 DIAGNOSIS — N200 Calculus of kidney: Secondary | ICD-10-CM

## 2016-11-23 NOTE — Progress Notes (Signed)
11/23/2016 3:27 PM   Tiffany Mcclain 04-08-1946 [id number]  Referring provider: Albina Billet, MD [address]   [place], [place] [address]  Chief Complaint  Patient presents with  . Nephrolithiasis    HPI: The patient is a 71 year old female who presents for postoperative follow-up. She initially had a right ureteral stent placed for a distal < 3 mm stone in the setting of sepsis. Once this resolved, she underwent definitive surgical management but it was found that her stone spontaneously passed. She was left with a stent with tether which was subsequently removed a few days later by the patient. She presents today to discuss a postop renal ultrasound.  Per the radiologist there is very mild hydroureteronephrosis, but on my review the images I see no any clinically significant obstruction on the right side. Bilateral ureteral jets were also seen making obstruction unlikely as well.   PMH: Past Medical History:  Diagnosis Date  . Anemia   . Anxiety   . Arthritis   . Clubbed foot    left  . Depression   . Duodenal ulcer 10/2016  . Family history of adverse reaction to anesthesia    father combative/AMS after anethesia   . Fracture of right humerus    hx of   . GERD (gastroesophageal reflux disease)   . Headache    hx of migraines   . History of hiatal hernia   . History of kidney stones   . Shortness of breath dyspnea    due to anemia  . UTI (urinary tract infection)     Surgical History: Past Surgical History:  Procedure Laterality Date  . ABDOMINAL HYSTERECTOMY    . COLONOSCOPY WITH PROPOFOL N/A 11/20/2016   Procedure: COLONOSCOPY WITH PROPOFOL;  Surgeon: Lucilla Lame, MD;  Location: [facility];  Service: Endoscopy;  Laterality: N/A;  . CYSTOSCOPY W/ RETROGRADES Right 10/18/2016   Procedure: CYSTOSCOPY WITH RETROGRADE PYELOGRAM;  Surgeon: Nickie Retort, MD;  Location: [facility];  Service: Urology;  Laterality: Right;  . CYSTOSCOPY W/ URETERAL  STENT PLACEMENT Right 10/18/2016   Procedure: CYSTOSCOPY WITH STENT REPLACEMENT;  Surgeon: Nickie Retort, MD;  Location: [facility];  Service: Urology;  Laterality: Right;  . CYSTOSCOPY WITH STENT PLACEMENT Right 10/03/2016   Procedure: CYSTOSCOPY WITH STENT PLACEMENT;  Surgeon: Cleon Gustin, MD;  Location: [facility];  Service: Urology;  Laterality: Right;  . ESOPHAGOGASTRODUODENOSCOPY N/A 10/05/2016   Procedure: ESOPHAGOGASTRODUODENOSCOPY (EGD);  Surgeon: Lucilla Lame, MD;  Location: [facility];  Service: Endoscopy;  Laterality: N/A;  . JOINT REPLACEMENT     Right hip  . POLYPECTOMY  11/20/2016   Procedure: POLYPECTOMY;  Surgeon: Lucilla Lame, MD;  Location: [facility];  Service: Endoscopy;;  . 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 URETEROSCOPY Right 10/18/2016   Procedure: URETEROSCOPY;  Surgeon: Nickie Retort, MD;  Location: [facility];  Service: Urology;  Laterality: Right;    Home Medications:  Allergies as of 11/23/2016      Reactions   Codeine Nausea Only   "clammy feeling"   Other Diarrhea   Red Meat      Medication List       Accurate as of 11/23/16  3:27 PM. Always use your most recent med list.          acetaminophen 650 MG CR  tablet Commonly known as:  TYLENOL Take 650 mg by mouth every 8 (eight) hours as needed for pain.   escitalopram 20 MG tablet Commonly known as:  LEXAPRO Take 10-20 mg by mouth every morning. Alternates and takes 0.5 tablet 1 day and 1 tablet the next   ferrous sulfate 325 (65 FE) MG tablet Take 1 tablet (325 mg total) by mouth daily with breakfast.   fluticasone 50 MCG/ACT nasal spray Commonly known as:  FLONASE Place 2 sprays into both nostrils daily.   loratadine 10 MG tablet Commonly known as:  CLARITIN Take 10 mg by mouth daily.   pantoprazole 40 MG  tablet Commonly known as:  PROTONIX Take 1 tablet (40 mg total) by mouth 2 (two) times daily before a meal.   SUMAtriptan 100 MG tablet Commonly known as:  IMITREX Take 100 mg by mouth every 2 (two) hours as needed for migraine. May repeat in 2 hours if headache persists or recurs.       Allergies:  Allergies  Allergen Reactions  . Codeine Nausea Only    "clammy feeling"  . Other Diarrhea    Red Meat    Family History: Family History  Problem Relation Age of Onset  . Hypertension Mother   . CAD Neg Hx     Social History:  reports that she has quit smoking. Her smoking use included Cigarettes. She smoked 0.00 packs per day. She has never used smokeless tobacco. She reports that she drinks about 4.2 oz of alcohol per week . She reports that she does not use drugs.  ROS: UROLOGY Frequent Urination?: No Hard to postpone urination?: No Burning/pain with urination?: No Get up at night to urinate?: No Leakage of urine?: No Urine stream starts and stops?: No Trouble starting stream?: No Do you have to strain to urinate?: No Blood in urine?: No Urinary tract infection?: No Sexually transmitted disease?: No Injury to kidneys or bladder?: No Painful intercourse?: No Weak stream?: No Currently pregnant?: No Vaginal bleeding?: No Last menstrual period?: n  Gastrointestinal Nausea?: No Vomiting?: No Indigestion/heartburn?: No Diarrhea?: No Constipation?: No  Constitutional Fever: No Night sweats?: No Weight loss?: No Fatigue?: No  Skin Skin rash/lesions?: No Itching?: No  Eyes Blurred vision?: No Double vision?: No  Ears/Nose/Throat Sore throat?: No Sinus problems?: No  Hematologic/Lymphatic Swollen glands?: No Easy bruising?: No  Cardiovascular Leg swelling?: No Chest pain?: No  Respiratory Cough?: No Shortness of breath?: No  Endocrine Excessive thirst?: No  Musculoskeletal Back pain?: No Joint pain?: No  Neurological Headaches?:  No Dizziness?: No  Psychologic Depression?: No Anxiety?: No  Physical Exam: BP 126/80   Pulse 96   Ht 5\' 4"  (1.626 m)   Wt 192 lb (87.1 kg)   BMI 32.96 kg/m   Constitutional:  Alert and oriented, No acute distress. HEENT: [place] AT, moist mucus membranes.  Trachea midline, no masses. Cardiovascular: No clubbing, cyanosis, or edema. Respiratory: Normal respiratory effort, no increased work of breathing. GI: Abdomen is soft, nontender, nondistended, no abdominal masses GU: No CVA tenderness.  Skin: No rashes, bruises or suspicious lesions. Lymph: No cervical or inguinal adenopathy. Neurologic: Grossly intact, no focal deficits, moving all 4 extremities. Psychiatric: Normal mood and affect.  Laboratory Data: Lab Results  Component Value Date   WBC 5.5 10/23/2016   HGB 12.0 10/23/2016   HCT 39.1 10/23/2016   MCV 82 10/23/2016   PLT 353 10/23/2016    Lab Results  Component Value Date   CREATININE 0.65 10/04/2016  No results found for: PSA  No results found for: TESTOSTERONE  Lab Results  Component Value Date   HGBA1C 5.4 10/03/2016    Urinalysis    Component Value Date/Time   COLORURINE YELLOW (A) 10/12/2016 1049   APPEARANCEUR HAZY (A) 10/12/2016 1049   LABSPEC 1.015 10/12/2016 1049   PHURINE 5.0 10/12/2016 1049   GLUCOSEU NEGATIVE 10/12/2016 1049   HGBUR MODERATE (A) 10/12/2016 1049   BILIRUBINUR NEGATIVE 10/12/2016 1049   KETONESUR NEGATIVE 10/12/2016 1049   PROTEINUR 30 (A) 10/12/2016 1049   UROBILINOGEN 0.2 04/27/2014 1045   NITRITE NEGATIVE 10/12/2016 1049   LEUKOCYTESUR TRACE (A) 10/12/2016 1049    Pertinent Imaging: Renal ultrasound reviewed as above  Assessment & Plan:    1. History of nephrolithiasis Essentially normal renal ultrasound postoperatively. No further intervention required. The patient will follow-up as needed.  Nickie Retort, MD  [facility] [address], [place], [place]  [address] [phone]

## 2018-07-28 IMAGING — DX DG CHEST 1V PORT
1 series · 1 of 1 positions shown · non-contrast
Comparison: None.

CLINICAL DATA: Fever and chills for 1 day.  Nonproductive cough.

EXAM:
PORTABLE CHEST 1 VIEW

[chest ap]
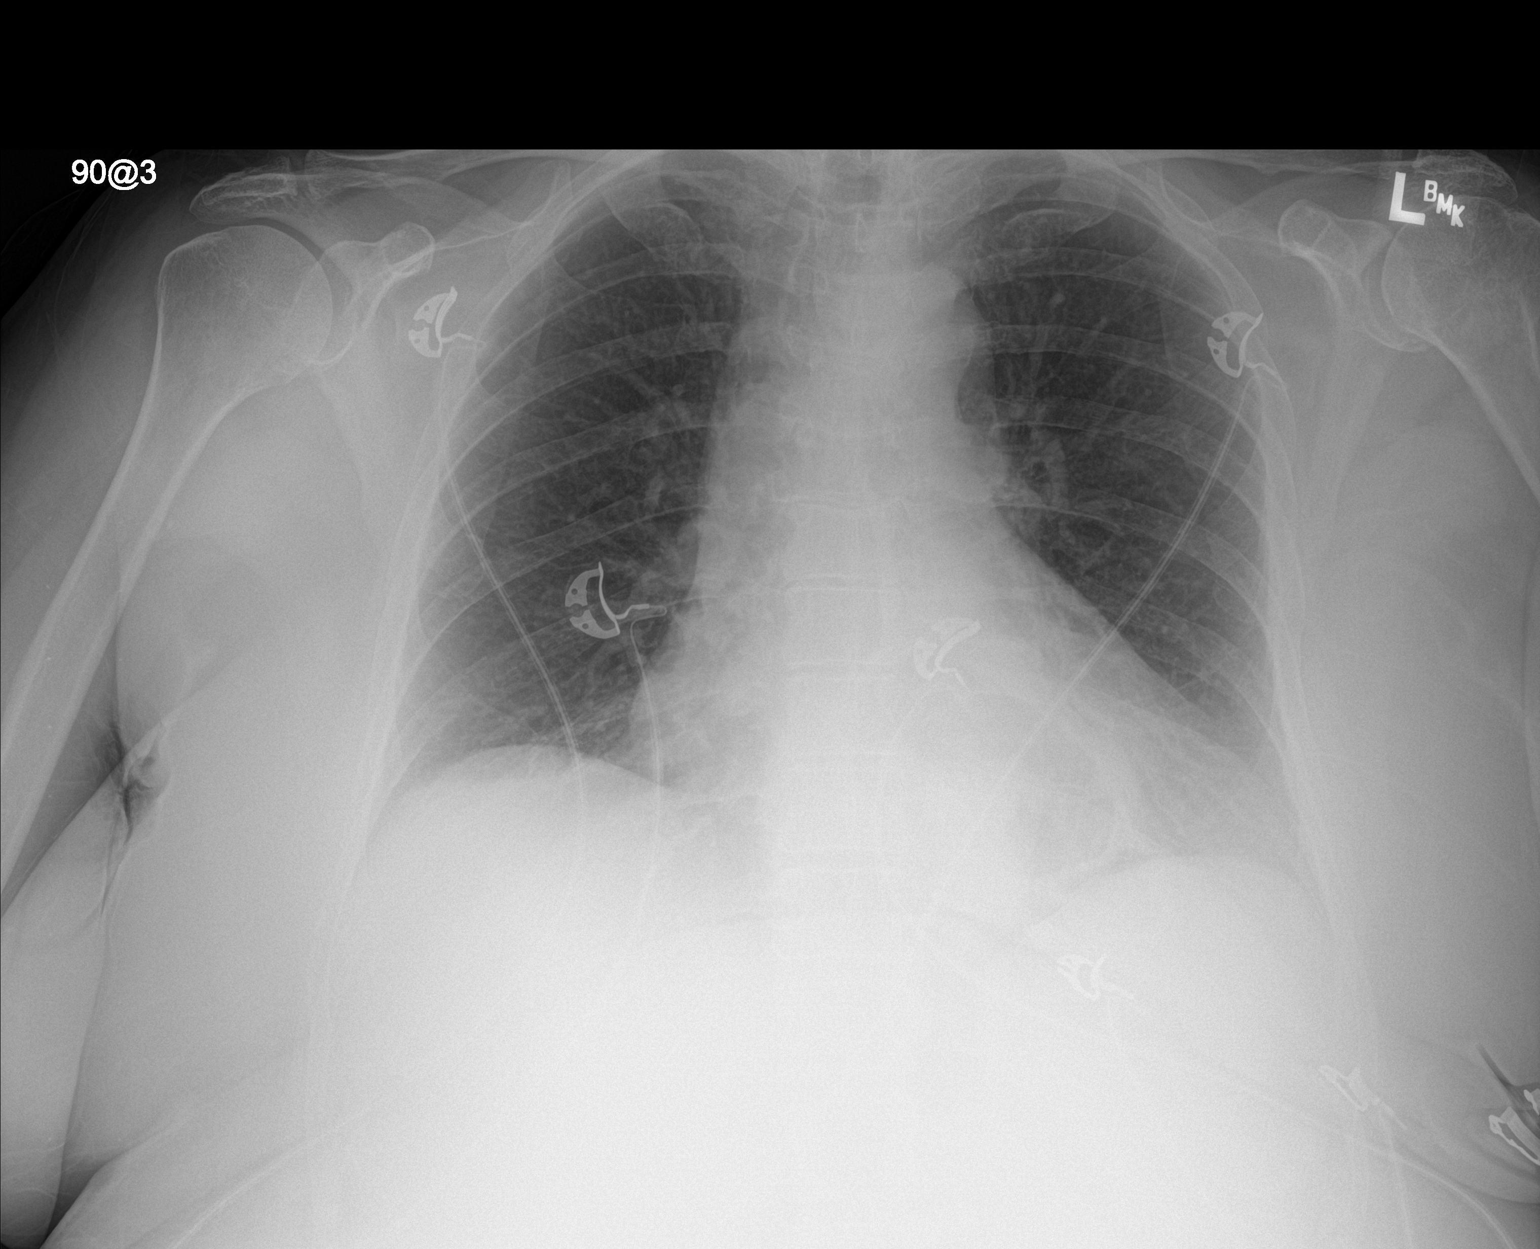

[1 of 1 positions shown; findings below may reference images not displayed]

FINDINGS: Probable hiatal hernia. Mild linear left lung base opacities may be
atelectatic. Right lung is clear. Pulmonary vasculature is normal.
No pleural effusion. Normal hilar and mediastinal contours.
IMPRESSION: Probable hiatal hernia. Mild atelectatic appearing left lung base
opacities.

## 2021-06-05 HISTORY — PX: EYE SURGERY: SHX253

## 2021-11-11 NOTE — Patient Instructions (Signed)
DUE TO COVID-19 ONLY TWO VISITORS  (aged 75 and older)  ARE ALLOWED TO COME WITH YOU AND STAY IN THE WAITING ROOM ONLY DURING PRE OP AND PROCEDURE.   **NO VISITORS ARE ALLOWED IN THE SHORT STAY AREA OR RECOVERY ROOM!!**  IF YOU WILL BE ADMITTED INTO THE HOSPITAL YOU ARE ALLOWED ONLY FOUR SUPPORT PEOPLE DURING VISITATION HOURS ONLY (7 AM -8PM)   The support person(s) must pass our screening, gel in and out, and wear a mask at all times, including in the patient's room. Patients must also wear a mask when staff or their support person are in the room. Visitors GUEST BADGE MUST BE WORN VISIBLY  One adult visitor may remain with you overnight and MUST be in the room by 8 P.M.     Your procedure is scheduled on: 11/28/21   Report to [facility] Main Entrance    Report to admitting at  12:05 PM   Call this number if you have problems the morning of surgery [phone]   Do not eat food :After Midnight.   After Midnight you may have the following liquids until _11:30__ AM/ DAY OF SURGERY  Water Black Coffee (sugar ok, NO MILK/CREAM OR CREAMERS)  Tea (sugar ok, NO MILK/CREAM OR CREAMERS) regular and decaf                             Plain Jell-O (NO RED)                                           Fruit ices (not with fruit pulp, NO RED)                                     Popsicles (NO RED)                                                                  Juice: apple, WHITE grape, WHITE cranberry Sports drinks like Gatorade (NO RED) Clear broth(vegetable,chicken,beef)                    The day of surgery:  Drink ONE (1) Pre-Surgery Clear Ensure  at 11:15 AM the morning of surgery. Drink in one sitting. Do not sip.  This drink was given to you during your hospital  pre-op appointment visit. Nothing else to drink after completing the  Pre-Surgery Clear Ensure at 11:30 AM          If you have questions, please contact your surgeon's office.  Oral Hygiene is also important  to reduce your risk of infection.                                    Remember - BRUSH YOUR TEETH THE MORNING OF SURGERY WITH YOUR REGULAR TOOTHPASTE    Take these medicines the morning of surgery with A SIP OF WATER: Lexapro, Atorvastatin, Flonase if needed  You may not have any metal on your body including hair pins, jewelry, and body piercing             Do not wear make-up, lotions, powders, perfumes/cologne, or deodorant  Do not wear nail polish including gel and S&S, artificial/acrylic nails, or any other type of covering on natural nails including finger and toenails. If you have artificial nails, gel coating, etc. that needs to be removed by a nail salon please have this removed prior to surgery or surgery may need to be canceled/ delayed if the surgeon/ anesthesia feels like they are unable to be safely monitored.   Do not shave  48 hours prior to surgery.    Do not bring valuables to the hospital. [place].   Contacts, dentures or bridgework may not be worn into surgery.   Bring small overnight bag day of surgery.   DO NOT [place]. PHARMACY WILL DISPENSE MEDICATIONS LISTED ON YOUR MEDICATION LIST TO YOU DURING YOUR ADMISSION [place]!       Special Instructions: Bring a copy of your healthcare power of attorney and living will documents the day of surgery if you haven't scanned them before.              Please read over the following fact sheets you were given: IF YOU HAVE QUESTIONS ABOUT YOUR PRE-OP INSTRUCTIONS PLEASE CALL [phone]     [facility] - Preparing for Surgery Before surgery, you can play an important role.  Because skin is not sterile, your skin needs to be as free of germs as possible.  You can reduce the number of germs on your skin by washing with CHG (chlorahexidine gluconate) soap before surgery.  CHG is an antiseptic cleaner which  kills germs and bonds with the skin to continue killing germs even after washing. Please DO NOT use if you have an allergy to CHG or antibacterial soaps.  If your skin becomes reddened/irritated stop using the CHG and inform your nurse when you arrive at Short Stay. Do not shave (including legs and underarms) for at least 48 hours prior to the first CHG shower.   Please follow these instructions carefully:  1.  Shower with CHG Soap the night before surgery and the  morning of Surgery.  2.  If you choose to wash your hair, wash your hair first as usual with your  normal  shampoo.  3.  After you shampoo, rinse your hair and body thoroughly to remove the  shampoo.                            4.  Use CHG as you would any other liquid soap.  You can apply chg directly  to the skin and wash                       Gently with a scrungie or clean washcloth.  5.  Apply the CHG Soap to your body ONLY FROM THE NECK DOWN.   Do not use on face/ open                           Wound or open sores. Avoid contact with eyes, ears mouth and genitals (private parts).  Wash face,  Genitals (private parts) with your normal soap.             6.  Wash thoroughly, paying special attention to the area where your surgery  will be performed.  7.  Thoroughly rinse your body with warm water from the neck down.  8.  DO NOT shower/wash with your normal soap after using and rinsing off  the CHG Soap.                9.  Pat yourself dry with a clean towel.            10.  Wear clean pajamas.            11.  Place clean sheets on your bed the night of your first shower and do not  sleep with pets. Day of Surgery : Do not apply any lotions/deodorants the morning of surgery.  Please wear clean clothes to the hospital/surgery center.  FAILURE TO FOLLOW THESE INSTRUCTIONS MAY RESULT IN THE CANCELLATION OF YOUR SURGERY PATIENT SIGNATURE_________________________________  NURSE  SIGNATURE__________________________________  ________________________________________________________________________   Tiffany Mcclain  An incentive spirometer is a tool that can help keep your lungs clear and active. This tool measures how well you are filling your lungs with each breath. Taking long deep breaths may help reverse or decrease the chance of developing breathing (pulmonary) problems (especially infection) following: A long period of time when you are unable to move or be active. BEFORE THE PROCEDURE  If the spirometer includes an indicator to show your best effort, your nurse or respiratory therapist will set it to a desired goal. If possible, sit up straight or lean slightly forward. Try not to slouch. Hold the incentive spirometer in an upright position. INSTRUCTIONS FOR USE  Sit on the edge of your bed if possible, or sit up as far as you can in bed or on a chair. Hold the incentive spirometer in an upright position. Breathe out normally. Place the mouthpiece in your mouth and seal your lips tightly around it. Breathe in slowly and as deeply as possible, raising the piston or the ball toward the top of the column. Hold your breath for 3-5 seconds or for as long as possible. Allow the piston or ball to fall to the bottom of the column. Remove the mouthpiece from your mouth and breathe out normally. Rest for a few seconds and repeat Steps 1 through 7 at least 10 times every 1-2 hours when you are awake. Take your time and take a few normal breaths between deep breaths. The spirometer may include an indicator to show your best effort. Use the indicator as a goal to work toward during each repetition. After each set of 10 deep breaths, practice coughing to be sure your lungs are clear. If you have an incision (the cut made at the time of surgery), support your incision when coughing by placing a pillow or rolled up towels firmly against it. Once you are able to get out of  bed, walk around indoors and cough well. You may stop using the incentive spirometer when instructed by your caregiver.  RISKS AND COMPLICATIONS Take your time so you do not get dizzy or light-headed. If you are in pain, you may need to take or ask for pain medication before doing incentive spirometry. It is harder to take a deep breath if you are having pain. AFTER USE Rest and breathe slowly and easily. It can be helpful to keep track  of a log of your progress. Your caregiver can provide you with a simple table to help with this. If you are using the spirometer at home, follow these instructions: [place] IF:  You are having difficultly using the spirometer. You have trouble using the spirometer as often as instructed. Your pain medication is not giving enough relief while using the spirometer. You develop fever of 100.5 F (38.1 C) or higher. SEEK IMMEDIATE MEDICAL CARE IF:  You cough up bloody sputum that had not been present before. You develop fever of 102 F (38.9 C) or greater. You develop worsening pain at or near the incision site. MAKE SURE YOU:  Understand these instructions. Will watch your condition. Will get help right away if you are not doing well or get worse. Document Released: 10/02/2006 Document Revised: 08/14/2011 Document Reviewed: 12/03/2006 [facility] Patient Information 2014 [place], [place].   ________________________________________________________________________

## 2021-11-14 ENCOUNTER — Encounter (HOSPITAL_COMMUNITY): Payer: Self-pay

## 2021-11-14 ENCOUNTER — Encounter (HOSPITAL_COMMUNITY)
Admission: RE | Admit: 2021-11-14 | Discharge: 2021-11-14 | Disposition: A | Discharge status: Home / Self Care | Payer: Medicare Other | Source: Ambulatory Visit | Attending: Orthopedic Surgery | Admitting: Orthopedic Surgery

## 2021-11-14 ENCOUNTER — Other Ambulatory Visit: Payer: Self-pay

## 2021-11-14 DIAGNOSIS — Z01818 Encounter for other preprocedural examination: Secondary | ICD-10-CM

## 2021-11-14 DIAGNOSIS — Z01812 Encounter for preprocedural laboratory examination: Secondary | ICD-10-CM | POA: Diagnosis present

## 2021-11-14 DIAGNOSIS — D500 Iron deficiency anemia secondary to blood loss (chronic): Secondary | ICD-10-CM | POA: Diagnosis not present

## 2021-11-14 LAB — CBC
HCT: 35.9 % — ABNORMAL LOW (ref 36.0–46.0)
Hemoglobin: 10.8 g/dL — ABNORMAL LOW (ref 12.0–15.0)
MCH: 28.1 pg (ref 26.0–34.0)
MCHC: 30.1 g/dL (ref 30.0–36.0)
MCV: 93.2 fL (ref 80.0–100.0)
Platelets: 371 10*3/uL (ref 150–400)
RBC: 3.85 MIL/uL — ABNORMAL LOW (ref 3.87–5.11)
RDW: 15.6 % — ABNORMAL HIGH (ref 11.5–15.5)
WBC: 6.2 10*3/uL (ref 4.0–10.5)
nRBC: 0 % (ref 0.0–0.2)

## 2021-11-14 LAB — BASIC METABOLIC PANEL
Anion gap: 10 (ref 5–15)
BUN: 12 mg/dL (ref 8–23)
CO2: 24 mmol/L (ref 22–32)
Calcium: 9.7 mg/dL (ref 8.9–10.3)
Chloride: 105 mmol/L (ref 98–111)
Creatinine, Ser: 0.78 mg/dL (ref 0.44–1.00)
GFR, Estimated: >60 mL/min (ref >60)
Glucose, Bld: 103 mg/dL — ABNORMAL HIGH (ref 70–99)
Potassium: 4.4 mmol/L (ref 3.5–5.1)
Sodium: 139 mmol/L (ref 135–145)

## 2021-11-14 LAB — SURGICAL PCR SCREEN
MRSA, PCR: NEGATIVE
Staphylococcus aureus: NEGATIVE

## 2021-11-14 NOTE — Progress Notes (Signed)
Anesthesia note:  Bowel prep reminder:NA  PCP - Dr. Keturah Barre. [place] Cardiologist -none Other-   Chest x-ray - no EKG - no Stress Test - no ECHO - no Cardiac Cath - NA  Pacemaker/ICD device last checked:NA  Sleep Study - no CPAP -   Pt is pre diabetic-NA Fasting Blood Sugar -  Checks Blood Sugar _____  Blood Thinner:NA Blood Thinner Instructions: Aspirin Instructions: Last Dose:  Anesthesia review: yes  Patient denies shortness of breath, fever, cough and chest pain at PAT appointment Pt uses a walker. She has an unsteady gait and pain. She has an ulcer the bleeds when she takes iron.and she reports feeling SOB when her blood counts are low.She will not take iron until after surgery.  Patient verbalized understanding of instructions that were given to them at the PAT appointment. Patient was also instructed that they will need to review over the PAT instructions again at home before surgery. Yes. Her husband was at the PAT visit with her.

## 2021-11-15 NOTE — H&P (Signed)
TOTAL HIP ADMISSION H&P  Patient is admitted for left total hip arthroplasty.  Subjective:  Chief Complaint: Left hip pain  HPI: Tiffany Mcclain, 76 y.o. female, has a history of pain and functional disability in the left hip due to arthritis and patient has failed non-surgical conservative treatments for greater than 12 weeks to include corticosteriod injections, use of assistive devices, and activity modification. Onset of symptoms was abrupt, starting  less than a year  ago with rapidlly worsening course since that time. The patient noted no past surgery on the left hip. Patient currently rates pain in the left hip at 9 out of 10 with activity. Patient has night pain, worsening of pain with activity and weight bearing, trendelenberg gait, pain that interfers with activities of daily living, and pain with passive range of motion. Patient has evidence of  bone-on-bone arthritis. She had an MRI which revealed a rapidly progressive arthritis  by imaging studies. This condition presents safety issues increasing the risk of falls. There is no current active infection.  Patient Active Problem List   Diagnosis Date Noted   Benign neoplasm of cecum    Rectal polyp    Iron deficiency anemia due to chronic blood loss    DU (duodenal ulcer)    Pyelonephritis 10/03/2016   OA (osteoarthritis) of hip 05/04/2014   Right hip pain 01/27/2014   Right lumbar radiculitis 01/27/2014    Past Medical History:  Diagnosis Date   Anemia    Anxiety    Arthritis    Clubbed foot    left   Depression    Duodenal ulcer 10/2016   Family history of adverse reaction to anesthesia    father combative/AMS after anethesia    Fracture of right humerus    hx of    GERD (gastroesophageal reflux disease)    Headache    hx of migraines    History of hiatal hernia    History of kidney stones    Shortness of breath dyspnea    due to anemia   UTI (urinary tract infection)     Past Surgical History:  Procedure  Laterality Date   ABDOMINAL HYSTERECTOMY  1980   COLONOSCOPY WITH PROPOFOL N/A 11/20/2016   Procedure: COLONOSCOPY WITH PROPOFOL;  Surgeon: Lucilla Lame, MD;  Location: [place];  Service: Endoscopy;  Laterality: N/A;   CYSTOSCOPY W/ RETROGRADES Right 10/18/2016   Procedure: CYSTOSCOPY WITH RETROGRADE PYELOGRAM;  Surgeon: Nickie Retort, MD;  Location: [facility];  Service: Urology;  Laterality: Right;   CYSTOSCOPY W/ URETERAL STENT PLACEMENT Right 10/18/2016   Procedure: CYSTOSCOPY WITH STENT REPLACEMENT;  Surgeon: Nickie Retort, MD;  Location: [facility];  Service: Urology;  Laterality: Right;   CYSTOSCOPY WITH STENT PLACEMENT Right 10/03/2016   Procedure: CYSTOSCOPY WITH STENT PLACEMENT;  Surgeon: Cleon Gustin, MD;  Location: [facility];  Service: Urology;  Laterality: Right;   ESOPHAGOGASTRODUODENOSCOPY N/A 10/05/2016   Procedure: ESOPHAGOGASTRODUODENOSCOPY (EGD);  Surgeon: Lucilla Lame, MD;  Location: [facility];  Service: Endoscopy;  Laterality: N/A;   EYE SURGERY  2023   POLYPECTOMY  11/20/2016   Procedure: POLYPECTOMY;  Surgeon: Lucilla Lame, MD;  Location: [place];  Service: Endoscopy;;   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   Procedure: RIGHT TOTAL HIP ARTHROPLASTY ANTERIOR APPROACH;  Surgeon: Gearlean Alf, MD;  Location: [facility];  Service: Orthopedics;  Laterality: Right;   URETEROSCOPY Right 10/18/2016   Procedure: URETEROSCOPY;  Surgeon: Nickie Retort, MD;  Location: [facility];  Service: Urology;  Laterality: Right;    Prior to Admission medications   Medication Sig Start Date End Date Taking? Authorizing Provider  acetaminophen (TYLENOL) 650 MG CR tablet Take 1,300 mg by mouth in the morning and at bedtime.   Yes [provider]  atorvastatin (LIPITOR) 10 MG tablet Take 10 mg by mouth in the morning.   Yes [provider]  diphenhydrAMINE (BENADRYL) 25 MG tablet Take 25 mg by mouth in the morning and at bedtime.    Yes [provider]  escitalopram (LEXAPRO) 20 MG tablet Take 10 mg by mouth every morning.   Yes [provider]  ferrous sulfate 325 (65 FE) MG tablet Take 325 mg by mouth daily as needed (2-3 days after taking meloxicam).   Yes [provider]  fluticasone (FLONASE) 50 MCG/ACT nasal spray Place 2 sprays into both nostrils daily as needed (sinus issues.).   Yes [provider]  meloxicam (MOBIC) 15 MG tablet Take 15 mg by mouth daily as needed (severe pain.).   Yes [provider]  Multiple Vitamins-Minerals (PRESERVISION AREDS 2 PO) Take 1 tablet by mouth in the morning.   Yes [provider]  Nutritional Supplements (JUICE PLUS FIBRE PO) Take 1 capsule by mouth in the morning. Juice Plus+ Omega Blend Capsules   Yes [provider]  SUMAtriptan (IMITREX) 100 MG tablet Take 100 mg by mouth every 2 (two) hours as needed for migraine. May repeat in 2 hours if headache persists or recurs.   Yes [provider]    Allergies  Allergen Reactions   Codeine Nausea Only    "clammy feeling"   Other Diarrhea    Red Meat    Social History   Socioeconomic History   Marital status: Married    Spouse name: Not on file   Number of children: Not on file   Years of education: Not on file   Highest education level: Not on file  Occupational History   Not on file  Tobacco Use   Smoking status: Former    Packs/day: 0.00    Years: 2.00    Total pack years: 0.00    Types: Cigarettes    Quit date: 63    Years since quitting: 49.4   Smokeless tobacco: Never   Tobacco comments:    smoked as teenager - 1 yr  Vaping Use   Vaping Use: Never used  Substance and Sexual Activity   Alcohol use: Yes    Alcohol/week: 7.0 standard drinks of alcohol    Types: 7 Glasses of wine per week    Comment: wine daily   Drug use: No   Sexual activity: Not on file  Other Topics Concern   Not on file  Social History Narrative   Not on file    Social Determinants of Health   Financial Resource Strain: Not on file  Food Insecurity: Not on file  Transportation Needs: Not on file  Physical Activity: Not on file  Stress: Not on file  Social Connections: Not on file  Intimate Partner Violence: Not on file    Tobacco Use: Medium Risk (11/14/2021)   Patient History    Smoking Tobacco Use: Former    Smokeless Tobacco Use: Never    Passive Exposure: Not on file   Social History   Substance and Sexual Activity  Alcohol Use Yes   Alcohol/week: 7.0 standard drinks of alcohol   Types: 7 Glasses of wine per week   Comment: wine  daily    Family History  Problem Relation Age of Onset   Hypertension Mother    CAD Neg Hx     Review of Systems  Constitutional:  Negative for chills and fever.  HENT:  Negative for congestion, sore throat and tinnitus.   Eyes:  Negative for double vision, photophobia and pain.  Respiratory:  Negative for cough, shortness of breath and wheezing.   Cardiovascular:  Negative for chest pain, palpitations and orthopnea.  Gastrointestinal:  Negative for heartburn, nausea and vomiting.  Genitourinary:  Negative for dysuria, frequency and urgency.  Musculoskeletal:  Positive for joint pain.  Neurological:  Negative for dizziness, weakness and headaches.     Objective:  Physical Exam: Well nourished and well developed.  General: Alert and oriented x3, cooperative and pleasant, no acute distress.  Head: normocephalic, atraumatic, neck supple.  Eyes: EOMI.  Musculoskeletal:  Left Hip Exam:  No tenderness to palpation about the left greater trochanteric bursa.  No pain with passive motion of the left hip.  ROM 110 degrees flexion, 20 degrees internal rotation, 30 degrees external rotation, and 30 degrees abduction.   Calves soft and nontender. Motor function intact in LE. Strength 5/5 LE bilaterally. Neuro: Distal pulses 2+. Sensation to light touch intact in LE.  Vital signs in last 24  hours: Temp:  [98 F (36.7 C)] 98 F (36.7 C) (06/12 1315) Pulse Rate:  [88] 88 (06/12 1315) Resp:  [20] 20 (06/12 1315) SpO2:  [98 %] 98 % (06/12 1315) Weight:  [83.9 kg] 83.9 kg (06/12 1315)  Imaging Review Plain radiographs demonstrate severe degenerative joint disease of the left hip. The bone quality appears to be adequate for age and reported activity level.  Assessment/Plan:  End stage arthritis, left hip  The patient history, physical examination, clinical judgement of the provider and imaging studies are consistent with end stage degenerative joint disease of the left hip and total hip arthroplasty is deemed medically necessary. The treatment options including medical management, injection therapy, arthroscopy and arthroplasty were discussed at length. The risks and benefits of total hip arthroplasty were presented and reviewed. The risks due to aseptic loosening, infection, stiffness, dislocation/subluxation, thromboembolic complications and other imponderables were discussed. The patient acknowledged the explanation, agreed to proceed with the plan and consent was signed. Patient is being admitted for inpatient treatment for surgery, pain control, PT, OT, prophylactic antibiotics, VTE prophylaxis, progressive ambulation and ADLs and discharge planning.The patient is planning to be discharged  home .   Patient's anticipated LOS is less than 2 midnights, meeting these requirements: - Younger than 15 - Lives within 1 hour of care - Has a competent adult at home to recover with post-op recover - NO history of  - Chronic pain requiring opiods  - Diabetes  - Coronary Artery Disease  - Heart failure  - Heart attack  - Stroke  - DVT/VTE  - Cardiac arrhythmia  - Respiratory Failure/COPD  - Renal failure  - Anemia  - Advanced Liver disease  Therapy Plans: HEP Disposition: Home with husband Planned DVT Prophylaxis: Xarelto 10 mg QD DME Needed: None PCP: Benita Stabile, MD  (clearance received) TXA: IV Allergies: Codeine (dizziness) Anesthesia Concerns: None BMI: 30.8 Last HgbA1c: Not diabetic Pharmacy: [facility]  Other: - Gastric ulcer since at least 2019 - followed by Dr. Hall Busing  - Tolerates hydrocodone, discussed lower dose of tramadol as well  - Patient was instructed on what medications to stop prior to surgery. - Follow-up visit in 2  weeks with Dr. Wynelle Link - Begin physical therapy following surgery - Pre-operative lab work as pre-surgical testing - Prescriptions will be provided in hospital at time of discharge  Theresa Duty, PA-C Orthopedic Surgery [facility] [place]

## 2021-11-28 ENCOUNTER — Ambulatory Visit (HOSPITAL_COMMUNITY): Payer: Medicare Other

## 2021-11-28 ENCOUNTER — Observation Stay (HOSPITAL_COMMUNITY)
Admission: RE | Admit: 2021-11-28 | Discharge: 2021-11-29 | Disposition: A | Discharge status: Home / Self Care | Payer: Medicare Other | Source: Ambulatory Visit | Attending: Orthopedic Surgery | Admitting: Orthopedic Surgery

## 2021-11-28 ENCOUNTER — Other Ambulatory Visit: Payer: Self-pay

## 2021-11-28 ENCOUNTER — Observation Stay (HOSPITAL_COMMUNITY): Payer: Medicare Other

## 2021-11-28 ENCOUNTER — Encounter (HOSPITAL_COMMUNITY)
Admission: RE | Disposition: A | Discharge status: Home / Self Care | Payer: Self-pay | Source: Ambulatory Visit | Attending: Orthopedic Surgery

## 2021-11-28 ENCOUNTER — Encounter (HOSPITAL_COMMUNITY): Payer: Self-pay | Admitting: Orthopedic Surgery

## 2021-11-28 ENCOUNTER — Ambulatory Visit (HOSPITAL_COMMUNITY): Payer: Medicare Other | Admitting: Physician Assistant

## 2021-11-28 ENCOUNTER — Ambulatory Visit (HOSPITAL_BASED_OUTPATIENT_CLINIC_OR_DEPARTMENT_OTHER): Payer: Medicare Other | Admitting: Anesthesiology

## 2021-11-28 DIAGNOSIS — Z87891 Personal history of nicotine dependence: Secondary | ICD-10-CM | POA: Diagnosis not present

## 2021-11-28 DIAGNOSIS — Z79899 Other long term (current) drug therapy: Secondary | ICD-10-CM | POA: Insufficient documentation

## 2021-11-28 DIAGNOSIS — Z96641 Presence of right artificial hip joint: Secondary | ICD-10-CM | POA: Diagnosis not present

## 2021-11-28 DIAGNOSIS — D500 Iron deficiency anemia secondary to blood loss (chronic): Secondary | ICD-10-CM

## 2021-11-28 DIAGNOSIS — M1612 Unilateral primary osteoarthritis, left hip: Principal | ICD-10-CM | POA: Diagnosis present

## 2021-11-28 DIAGNOSIS — Z01818 Encounter for other preprocedural examination: Secondary | ICD-10-CM

## 2021-11-28 HISTORY — PX: TOTAL HIP ARTHROPLASTY: SHX124

## 2021-11-28 LAB — TYPE AND SCREEN
ABO/RH(D): A POS
Antibody Screen: NEGATIVE

## 2021-11-28 SURGERY — ARTHROPLASTY, HIP, TOTAL, ANTERIOR APPROACH
Anesthesia: Spinal | Site: Hip | Laterality: Left

## 2021-11-28 MED ORDER — ACETAMINOPHEN 10 MG/ML IV SOLN
1000.0000 mg | Freq: Four times a day (QID) | INTRAVENOUS | Status: DC
  Administered 2021-11-28: 1000 mg via INTRAVENOUS
  Filled 2021-11-28: qty 100

## 2021-11-28 MED ORDER — METHOCARBAMOL 500 MG PO TABS
500.0000 mg | ORAL_TABLET | Freq: Four times a day (QID) | ORAL | Status: DC | PRN
  Administered 2021-11-28 – 2021-11-29 (×2): 500 mg via ORAL
  Filled 2021-11-28 (×2): qty 1

## 2021-11-28 MED ORDER — HYDROCODONE-ACETAMINOPHEN 5-325 MG PO TABS
1.0000 | ORAL_TABLET | ORAL | Status: DC | PRN
  Administered 2021-11-28 – 2021-11-29 (×3): 2 via ORAL
  Filled 2021-11-28 (×3): qty 2

## 2021-11-28 MED ORDER — RIVAROXABAN 10 MG PO TABS
10.0000 mg | ORAL_TABLET | Freq: Every day | ORAL | Status: DC
  Administered 2021-11-29: 10 mg via ORAL
  Filled 2021-11-28: qty 1

## 2021-11-28 MED ORDER — ATORVASTATIN CALCIUM 10 MG PO TABS
10.0000 mg | ORAL_TABLET | Freq: Every day | ORAL | Status: DC
  Administered 2021-11-28 – 2021-11-29 (×2): 10 mg via ORAL
  Filled 2021-11-28 (×2): qty 1

## 2021-11-28 MED ORDER — MIDAZOLAM HCL 2 MG/2ML IJ SOLN
INTRAMUSCULAR | Status: DC | PRN
  Administered 2021-11-28: 2 mg via INTRAVENOUS

## 2021-11-28 MED ORDER — POVIDONE-IODINE 10 % EX SWAB
2.0000 | Freq: Once | CUTANEOUS | Status: AC
  Administered 2021-11-28: 2 via TOPICAL

## 2021-11-28 MED ORDER — LACTATED RINGERS IV SOLN: INTRAVENOUS | Status: DC

## 2021-11-28 MED ORDER — CEFAZOLIN SODIUM-DEXTROSE 2-4 GM/100ML-% IV SOLN
2.0000 g | Freq: Four times a day (QID) | INTRAVENOUS | Status: AC
  Administered 2021-11-28 – 2021-11-29 (×2): 2 g via INTRAVENOUS
  Filled 2021-11-28 (×2): qty 100

## 2021-11-28 MED ORDER — BUPIVACAINE IN DEXTROSE 0.75-8.25 % IT SOLN
INTRATHECAL | Status: DC | PRN
  Administered 2021-11-28: 1.6 mL via INTRATHECAL

## 2021-11-28 MED ORDER — POLYETHYLENE GLYCOL 3350 17 G PO PACK: 17.0000 g | PACK | Freq: Every day | ORAL | Status: DC | PRN

## 2021-11-28 MED ORDER — ONDANSETRON HCL 4 MG/2ML IJ SOLN
INTRAMUSCULAR | Status: DC | PRN
  Administered 2021-11-28: 4 mg via INTRAVENOUS

## 2021-11-28 MED ORDER — METOCLOPRAMIDE HCL 5 MG PO TABS: 5.0000 mg | ORAL_TABLET | Freq: Three times a day (TID) | ORAL | Status: DC | PRN

## 2021-11-28 MED ORDER — WATER FOR IRRIGATION, STERILE IR SOLN
Status: DC | PRN
  Administered 2021-11-28: 2000 mL

## 2021-11-28 MED ORDER — DEXAMETHASONE SODIUM PHOSPHATE 10 MG/ML IJ SOLN
8.0000 mg | Freq: Once | INTRAMUSCULAR | Status: AC
  Administered 2021-11-28: 8 mg via INTRAVENOUS

## 2021-11-28 MED ORDER — PHENYLEPHRINE HCL (PRESSORS) 10 MG/ML IV SOLN
INTRAVENOUS | Status: AC
  Filled 2021-11-28: qty 1

## 2021-11-28 MED ORDER — DIPHENHYDRAMINE HCL 12.5 MG/5ML PO ELIX: 12.5000 mg | ORAL_SOLUTION | ORAL | Status: DC | PRN

## 2021-11-28 MED ORDER — MORPHINE SULFATE (PF) 2 MG/ML IV SOLN: 0.5000 mg | INTRAVENOUS | Status: DC | PRN

## 2021-11-28 MED ORDER — FLEET ENEMA 7-19 GM/118ML RE ENEM: 1.0000 | ENEMA | Freq: Once | RECTAL | Status: DC | PRN

## 2021-11-28 MED ORDER — PROPOFOL 500 MG/50ML IV EMUL
INTRAVENOUS | Status: DC | PRN
  Administered 2021-11-28: 75 ug/kg/min via INTRAVENOUS

## 2021-11-28 MED ORDER — MENTHOL 3 MG MT LOZG: 1.0000 | LOZENGE | OROMUCOSAL | Status: DC | PRN

## 2021-11-28 MED ORDER — PHENOL 1.4 % MT LIQD
1.0000 | OROMUCOSAL | Status: DC | PRN
Start: 2021-11-28 — End: 2021-11-29

## 2021-11-28 MED ORDER — PHENYLEPHRINE HCL-NACL 20-0.9 MG/250ML-% IV SOLN
INTRAVENOUS | Status: DC | PRN
  Administered 2021-11-28: 30 ug/min via INTRAVENOUS

## 2021-11-28 MED ORDER — ORAL CARE MOUTH RINSE
15.0000 mL | Freq: Once | OROMUCOSAL | Status: AC
  Administered 2021-11-28: 15 mL via OROMUCOSAL

## 2021-11-28 MED ORDER — ONDANSETRON HCL 4 MG/2ML IJ SOLN: 4.0000 mg | Freq: Four times a day (QID) | INTRAMUSCULAR | Status: DC | PRN

## 2021-11-28 MED ORDER — OXYCODONE HCL 5 MG/5ML PO SOLN: 5.0000 mg | Freq: Once | ORAL | Status: DC | PRN

## 2021-11-28 MED ORDER — TRANEXAMIC ACID-NACL 1000-0.7 MG/100ML-% IV SOLN
1000.0000 mg | INTRAVENOUS | Status: AC
  Administered 2021-11-28: 1000 mg via INTRAVENOUS
  Filled 2021-11-28: qty 100

## 2021-11-28 MED ORDER — METHOCARBAMOL 1000 MG/10ML IJ SOLN: 500.0000 mg | Freq: Four times a day (QID) | INTRAVENOUS | Status: DC | PRN

## 2021-11-28 MED ORDER — 0.9 % SODIUM CHLORIDE (POUR BTL) OPTIME
TOPICAL | Status: DC | PRN
  Administered 2021-11-28: 1000 mL

## 2021-11-28 MED ORDER — CHLORHEXIDINE GLUCONATE 0.12 % MT SOLN
15.0000 mL | Freq: Once | OROMUCOSAL | Status: AC
Start: 2021-11-28 — End: 2021-11-28

## 2021-11-28 MED ORDER — PROPOFOL 500 MG/50ML IV EMUL
INTRAVENOUS | Status: AC
  Filled 2021-11-28: qty 50

## 2021-11-28 MED ORDER — PROPOFOL 10 MG/ML IV BOLUS
INTRAVENOUS | Status: DC | PRN
  Administered 2021-11-28 (×2): 20 mg via INTRAVENOUS

## 2021-11-28 MED ORDER — DEXAMETHASONE SODIUM PHOSPHATE 10 MG/ML IJ SOLN
INTRAMUSCULAR | Status: AC
  Filled 2021-11-28: qty 1

## 2021-11-28 MED ORDER — FENTANYL CITRATE PF 50 MCG/ML IJ SOSY: 25.0000 ug | PREFILLED_SYRINGE | INTRAMUSCULAR | Status: DC | PRN

## 2021-11-28 MED ORDER — PHENYLEPHRINE 80 MCG/ML (10ML) SYRINGE FOR IV PUSH (FOR BLOOD PRESSURE SUPPORT)
PREFILLED_SYRINGE | INTRAVENOUS | Status: DC | PRN
  Administered 2021-11-28: 80 ug via INTRAVENOUS

## 2021-11-28 MED ORDER — ONDANSETRON HCL 4 MG/2ML IJ SOLN: 4.0000 mg | Freq: Once | INTRAMUSCULAR | Status: DC | PRN

## 2021-11-28 MED ORDER — EPHEDRINE 5 MG/ML INJ
INTRAVENOUS | Status: AC
  Filled 2021-11-28: qty 5

## 2021-11-28 MED ORDER — ESCITALOPRAM OXALATE 10 MG PO TABS
10.0000 mg | ORAL_TABLET | Freq: Every morning | ORAL | Status: DC
  Administered 2021-11-29: 10 mg via ORAL
  Filled 2021-11-28: qty 1

## 2021-11-28 MED ORDER — ACETAMINOPHEN 325 MG PO TABS: 325.0000 mg | ORAL_TABLET | Freq: Four times a day (QID) | ORAL | Status: DC | PRN

## 2021-11-28 MED ORDER — CEFAZOLIN SODIUM-DEXTROSE 2-4 GM/100ML-% IV SOLN
2.0000 g | INTRAVENOUS | Status: AC
  Administered 2021-11-28: 2 g via INTRAVENOUS
  Filled 2021-11-28: qty 100

## 2021-11-28 MED ORDER — ONDANSETRON HCL 4 MG PO TABS: 4.0000 mg | ORAL_TABLET | Freq: Four times a day (QID) | ORAL | Status: DC | PRN

## 2021-11-28 MED ORDER — SODIUM CHLORIDE 0.9 % IV SOLN: INTRAVENOUS | Status: DC

## 2021-11-28 MED ORDER — BUPIVACAINE-EPINEPHRINE 0.25% -1:200000 IJ SOLN
INTRAMUSCULAR | Status: AC
  Filled 2021-11-28: qty 1

## 2021-11-28 MED ORDER — AMISULPRIDE (ANTIEMETIC) 5 MG/2ML IV SOLN: 10.0000 mg | Freq: Once | INTRAVENOUS | Status: DC | PRN

## 2021-11-28 MED ORDER — ONDANSETRON HCL 4 MG/2ML IJ SOLN
INTRAMUSCULAR | Status: AC
  Filled 2021-11-28: qty 2

## 2021-11-28 MED ORDER — OXYCODONE HCL 5 MG PO TABS: 5.0000 mg | ORAL_TABLET | Freq: Once | ORAL | Status: DC | PRN

## 2021-11-28 MED ORDER — MIDAZOLAM HCL 2 MG/2ML IJ SOLN
INTRAMUSCULAR | Status: AC
  Filled 2021-11-28: qty 2

## 2021-11-28 MED ORDER — DOCUSATE SODIUM 100 MG PO CAPS
100.0000 mg | ORAL_CAPSULE | Freq: Two times a day (BID) | ORAL | Status: DC
  Administered 2021-11-28: 100 mg via ORAL
  Filled 2021-11-28: qty 1

## 2021-11-28 MED ORDER — BUPIVACAINE-EPINEPHRINE (PF) 0.25% -1:200000 IJ SOLN
INTRAMUSCULAR | Status: DC | PRN
  Administered 2021-11-28: 30 mL via PERINEURAL

## 2021-11-28 MED ORDER — DEXAMETHASONE SODIUM PHOSPHATE 10 MG/ML IJ SOLN
10.0000 mg | Freq: Once | INTRAMUSCULAR | Status: AC
  Administered 2021-11-29: 10 mg via INTRAVENOUS
  Filled 2021-11-28: qty 1

## 2021-11-28 MED ORDER — BISACODYL 10 MG RE SUPP: 10.0000 mg | Freq: Every day | RECTAL | Status: DC | PRN

## 2021-11-28 MED ORDER — METOCLOPRAMIDE HCL 5 MG/ML IJ SOLN: 5.0000 mg | Freq: Three times a day (TID) | INTRAMUSCULAR | Status: DC | PRN

## 2021-11-28 MED ORDER — TRAMADOL HCL 50 MG PO TABS
50.0000 mg | ORAL_TABLET | Freq: Four times a day (QID) | ORAL | Status: DC | PRN
  Administered 2021-11-28 – 2021-11-29 (×2): 50 mg via ORAL
  Filled 2021-11-28 (×2): qty 1

## 2021-11-28 MED ORDER — EPHEDRINE SULFATE-NACL 50-0.9 MG/10ML-% IV SOSY
PREFILLED_SYRINGE | INTRAVENOUS | Status: DC | PRN
  Administered 2021-11-28: 5 mg via INTRAVENOUS

## 2021-11-28 SURGICAL SUPPLY — 45 items
BAG COUNTER SPONGE SURGICOUNT (BAG) ×1 IMPLANT
BAG DECANTER FOR FLEXI CONT (MISCELLANEOUS) IMPLANT
BAG ZIPLOCK 12X15 (MISCELLANEOUS) IMPLANT
BLADE SAG 18X100X1.27 (BLADE) ×2 IMPLANT
CLSR STERI-STRIP ANTIMIC 1/2X4 (GAUZE/BANDAGES/DRESSINGS) ×1 IMPLANT
COVER PERINEAL POST (MISCELLANEOUS) ×2 IMPLANT
COVER SURGICAL LIGHT HANDLE (MISCELLANEOUS) ×2 IMPLANT
CUP ACETBLR 48 OD SECTOR II (Hips) ×1 IMPLANT
DRAPE FOOT SWITCH (DRAPES) ×2 IMPLANT
DRAPE STERI IOBAN 125X83 (DRAPES) ×2 IMPLANT
DRAPE U-SHAPE 47X51 STRL (DRAPES) ×4 IMPLANT
DRSG AQUACEL AG ADV 3.5X10 (GAUZE/BANDAGES/DRESSINGS) ×2 IMPLANT
DURAPREP 26ML APPLICATOR (WOUND CARE) ×2 IMPLANT
ELECT REM PT RETURN 15FT ADLT (MISCELLANEOUS) ×2 IMPLANT
GLOVE BIO SURGEON STRL SZ 6.5 (GLOVE) IMPLANT
GLOVE BIO SURGEON STRL SZ7.5 (GLOVE) IMPLANT
GLOVE BIO SURGEON STRL SZ8 (GLOVE) ×2 IMPLANT
GLOVE BIOGEL PI IND STRL 6.5 (GLOVE) IMPLANT
GLOVE BIOGEL PI IND STRL 7.0 (GLOVE) IMPLANT
GLOVE BIOGEL PI IND STRL 8 (GLOVE) ×1 IMPLANT
GLOVE BIOGEL PI INDICATOR 6.5 (GLOVE) ×1
GLOVE BIOGEL PI INDICATOR 7.0 (GLOVE) ×1
GLOVE BIOGEL PI INDICATOR 8 (GLOVE) ×1
GOWN STRL REUS W/ TWL LRG LVL3 (GOWN DISPOSABLE) ×1 IMPLANT
GOWN STRL REUS W/ TWL XL LVL3 (GOWN DISPOSABLE) IMPLANT
GOWN STRL REUS W/TWL LRG LVL3 (GOWN DISPOSABLE) ×1
GOWN STRL REUS W/TWL XL LVL3 (GOWN DISPOSABLE)
HEAD FEM STD 28X+1.5 STRL (Hips) ×1 IMPLANT
HOLDER FOLEY CATH W/STRAP (MISCELLANEOUS) ×2 IMPLANT
KIT TURNOVER KIT A (KITS) ×1 IMPLANT
LINER ACET ALTRX NEUT +4X28X48 (Hips) ×1 IMPLANT
MANIFOLD NEPTUNE II (INSTRUMENTS) ×2 IMPLANT
PACK ANTERIOR HIP CUSTOM (KITS) ×2 IMPLANT
PENCIL SMOKE EVACUATOR COATED (MISCELLANEOUS) ×2 IMPLANT
SPIKE FLUID TRANSFER (MISCELLANEOUS) ×2 IMPLANT
STEM FEM ACTIS STD SZ4 (Stem) ×1 IMPLANT
STRIP CLOSURE SKIN 1/2X4 (GAUZE/BANDAGES/DRESSINGS) ×2 IMPLANT
SUT ETHIBOND NAB CT1 #1 30IN (SUTURE) ×2 IMPLANT
SUT MNCRL AB 4-0 PS2 18 (SUTURE) ×2 IMPLANT
SUT STRATAFIX 0 PDS 27 VIOLET (SUTURE) ×2
SUT VIC AB 2-0 CT1 27 (SUTURE) ×2
SUT VIC AB 2-0 CT1 TAPERPNT 27 (SUTURE) ×2 IMPLANT
SUTURE STRATFX 0 PDS 27 VIOLET (SUTURE) ×1 IMPLANT
TRAY FOLEY MTR SLVR 16FR STAT (SET/KITS/TRAYS/PACK) ×2 IMPLANT
TUBE SUCTION HIGH CAP CLEAR NV (SUCTIONS) ×2 IMPLANT

## 2021-11-28 NOTE — Transfer of Care (Signed)
Immediate Anesthesia Transfer of Care Note  Patient: Tiffany Mcclain  Procedure(s) Performed: TOTAL HIP ARTHROPLASTY ANTERIOR APPROACH (Left: Hip)  Patient Location: PACU  Anesthesia Type:Spinal  Level of Consciousness: awake and patient cooperative  Airway & Oxygen Therapy: Patient Spontanous Breathing and Patient connected to face mask  Post-op Assessment: Report given to RN and Post -op Vital signs reviewed and stable  Post vital signs: Reviewed and stable  Last Vitals:  Vitals Value Taken Time  BP 124/102 11/28/21 1628  Temp    Pulse 72 11/28/21 1631  Resp 14 11/28/21 1631  SpO2 100 % 11/28/21 1631  Vitals shown include unvalidated device data.  Last Pain:  Vitals:   11/28/21 1211  TempSrc:   PainSc: 0-No pain         Complications: No notable events documented.

## 2021-11-28 NOTE — Anesthesia Preprocedure Evaluation (Addendum)
Anesthesia Evaluation  Patient identified by MRN, date of birth, ID band Patient awake    Reviewed: Allergy & Precautions, NPO status , Patient's Chart, lab work & pertinent test results  Airway Mallampati: III  TM Distance: >3 FB Neck ROM: Full    Dental no notable dental hx.    Pulmonary neg pulmonary ROS, former smoker,    Pulmonary exam normal breath sounds clear to auscultation       Cardiovascular negative cardio ROS Normal cardiovascular exam Rhythm:Regular Rate:Normal     Neuro/Psych  Headaches, PSYCHIATRIC DISORDERS Anxiety Depression  Neuromuscular disease (lumbar radiculitis)    GI/Hepatic Neg liver ROS, hiatal hernia, PUD, GERD  ,  Endo/Other  negative endocrine ROS  Renal/GU negative Renal ROS  negative genitourinary   Musculoskeletal  (+) Arthritis , Osteoarthritis,    Abdominal   Peds negative pediatric ROS (+)  Hematology  (+) Blood dyscrasia, anemia ,   Anesthesia Other Findings   Reproductive/Obstetrics negative OB ROS                          Anesthesia Physical Anesthesia Plan  ASA: 2  Anesthesia Plan: Spinal and MAC   Post-op Pain Management: Minimal or no pain anticipated   Induction: Intravenous  PONV Risk Score and Plan: 2 and Treatment may vary due to age or medical condition, TIVA, Propofol infusion, Ondansetron and Dexamethasone  Airway Management Planned: Natural Airway and Simple Face Mask  Additional Equipment: None  Intra-op Plan:   Post-operative Plan: Extubation in OR  Informed Consent: I have reviewed the patients History and Physical, chart, labs and discussed the procedure including the risks, benefits and alternatives for the proposed anesthesia with the patient or authorized representative who has indicated his/her understanding and acceptance.     Dental advisory given  Plan Discussed with: CRNA, Anesthesiologist and  Surgeon  Anesthesia Plan Comments:       Anesthesia Quick Evaluation

## 2021-11-29 ENCOUNTER — Encounter (HOSPITAL_COMMUNITY): Payer: Self-pay | Admitting: Orthopedic Surgery

## 2021-11-29 DIAGNOSIS — M1612 Unilateral primary osteoarthritis, left hip: Secondary | ICD-10-CM | POA: Diagnosis not present

## 2021-11-29 LAB — BASIC METABOLIC PANEL
Anion gap: 9 (ref 5–15)
BUN: 14 mg/dL (ref 8–23)
CO2: 23 mmol/L (ref 22–32)
Calcium: 8.8 mg/dL — ABNORMAL LOW (ref 8.9–10.3)
Chloride: 107 mmol/L (ref 98–111)
Creatinine, Ser: 0.81 mg/dL (ref 0.44–1.00)
GFR, Estimated: >60 mL/min (ref >60)
Glucose, Bld: 144 mg/dL — ABNORMAL HIGH (ref 70–99)
Potassium: 4.6 mmol/L (ref 3.5–5.1)
Sodium: 139 mmol/L (ref 135–145)

## 2021-11-29 LAB — CBC
HCT: 30 % — ABNORMAL LOW (ref 36.0–46.0)
Hemoglobin: 9.1 g/dL — ABNORMAL LOW (ref 12.0–15.0)
MCH: 27.8 pg (ref 26.0–34.0)
MCHC: 30.3 g/dL (ref 30.0–36.0)
MCV: 91.7 fL (ref 80.0–100.0)
Platelets: 288 10*3/uL (ref 150–400)
RBC: 3.27 MIL/uL — ABNORMAL LOW (ref 3.87–5.11)
RDW: 16 % — ABNORMAL HIGH (ref 11.5–15.5)
WBC: 7.7 10*3/uL (ref 4.0–10.5)
nRBC: 0 % (ref 0.0–0.2)

## 2021-11-29 MED ORDER — METHOCARBAMOL 500 MG PO TABS: 500.0000 mg | ORAL_TABLET | Freq: Four times a day (QID) | ORAL | 0 refills | Status: DC | PRN

## 2021-11-29 MED ORDER — TRAMADOL HCL 50 MG PO TABS: 50.0000 mg | ORAL_TABLET | Freq: Four times a day (QID) | ORAL | 0 refills | Status: DC | PRN

## 2021-11-29 MED ORDER — RIVAROXABAN 10 MG PO TABS: 10.0000 mg | ORAL_TABLET | Freq: Every day | ORAL | 0 refills | Status: DC

## 2021-11-29 MED ORDER — PHENYLEPHRINE HCL-NACL 20-0.9 MG/250ML-% IV SOLN
INTRAVENOUS | Status: AC
  Filled 2021-11-29: qty 500

## 2021-11-29 MED ORDER — HYDROCODONE-ACETAMINOPHEN 5-325 MG PO TABS: 1.0000 | ORAL_TABLET | Freq: Four times a day (QID) | ORAL | 0 refills | Status: DC | PRN

## 2021-11-29 NOTE — Plan of Care (Signed)
  Problem: Education: Goal: Knowledge of General Education information will improve Description: Including pain rating scale, medication(s)/side effects and non-pharmacologic comfort measures Outcome: Adequate for Discharge   Problem: Health Behavior/Discharge Planning: Goal: Ability to manage health-related needs will improve Outcome: Adequate for Discharge   Problem: Clinical Measurements: Goal: Ability to maintain clinical measurements within normal limits will improve Outcome: Adequate for Discharge Goal: Will remain free from infection Outcome: Adequate for Discharge Goal: Diagnostic test results will improve Outcome: Adequate for Discharge Goal: Respiratory complications will improve Outcome: Adequate for Discharge Goal: Cardiovascular complication will be avoided Outcome: Adequate for Discharge   Problem: Activity: Goal: Risk for activity intolerance will decrease Outcome: Adequate for Discharge   Problem: Nutrition: Goal: Adequate nutrition will be maintained Outcome: Adequate for Discharge   Problem: Coping: Goal: Level of anxiety will decrease Outcome: Adequate for Discharge   Problem: Elimination: Goal: Will not experience complications related to bowel motility Outcome: Adequate for Discharge Goal: Will not experience complications related to urinary retention Outcome: Adequate for Discharge   Problem: Pain Managment: Goal: General experience of comfort will improve Outcome: Adequate for Discharge   Problem: Safety: Goal: Ability to remain free from injury will improve Outcome: Adequate for Discharge   Problem: Skin Integrity: Goal: Risk for impaired skin integrity will decrease Outcome: Adequate for Discharge   Problem: Education: Goal: Knowledge of the prescribed therapeutic regimen will improve Outcome: Adequate for Discharge Goal: Understanding of discharge needs will improve Outcome: Adequate for Discharge Goal: Individualized Educational  Video(s) Outcome: Adequate for Discharge   Problem: Activity: Goal: Ability to avoid complications of mobility impairment will improve Outcome: Adequate for Discharge Goal: Ability to tolerate increased activity will improve Outcome: Adequate for Discharge   Problem: Clinical Measurements: Goal: Postoperative complications will be avoided or minimized Outcome: Adequate for Discharge   Problem: Pain Management: Goal: Pain level will decrease with appropriate interventions Outcome: Adequate for Discharge   Problem: Skin Integrity: Goal: Will show signs of wound healing Outcome: Adequate for Discharge  Discharge teaching done, written information given.

## 2021-11-29 NOTE — Evaluation (Addendum)
Physical Therapy Evaluation Patient Details Name: Tiffany Mcclain MRN: [id number] DOB: May 03, 1946 Today's Date: 11/29/2021  History of Present Illness  L DATHA on 11/28/21. PMH: RDATHA  Clinical Impression  The patient is progressing well. Patient eager to Dc home. PT goals are met.        Recommendations for follow up therapy are one component of a multi-disciplinary discharge planning process, led by the attending physician.  Recommendations may be updated based on patient status, additional functional criteria and insurance authorization.  Follow Up Recommendations Follow physician's recommendations for discharge plan and follow up therapies      Assistance Recommended at Discharge Set up Supervision/Assistance  Patient can return home with the following  Assist for transportation;Assistance with cooking/housework;Help with stairs or ramp for entrance    Equipment Recommendations None recommended by PT  Recommendations for Other Services       Functional Status Assessment Patient has had a recent decline in their functional status and demonstrates the ability to make significant improvements in function in a reasonable and predictable amount of time.     Precautions / Restrictions Precautions Precautions: Fall      Mobility  Bed Mobility Overal bed mobility: Needs Assistance Bed Mobility: Supine to Sit     Supine to sit: Supervision          Transfers Overall transfer level: Needs assistance Equipment used: Rolling walker (2 wheels) Transfers: Sit to/from Stand Sit to Stand: Supervision                Ambulation/Gait Ambulation/Gait assistance: Supervision Gait Distance (Feet): 150 Feet Assistive device: Rolling walker (2 wheels) Gait Pattern/deviations: Step-to pattern, Step-through pattern, Antalgic Gait velocity: decr     General Gait Details: cues for sequence  Stairs forwards up with ;eft rail and cane and down turned sideways,. Spouse  present to assist.            Wheelchair Mobility    Modified Rankin (Stroke Patients Only)       Balance Overall balance assessment: No apparent balance deficits (not formally assessed)                                           Pertinent Vitals/Pain Pain Assessment Pain Assessment: 0-10 Pain Score: 6  Pain Location: left hip and thigh Pain Descriptors / Indicators: Discomfort Pain Intervention(s): Monitored during session, Premedicated before session, Ice applied    Home Living Family/patient expects to be discharged to:: Private residence Living Arrangements: Spouse/significant other Available Help at Discharge: Family;Available 24 hours/day Type of Home: House Home Access: Stairs to enter Entrance Stairs-Rails: Left Entrance Stairs-Number of Steps: 5   Home Layout: Two level;Able to live on main level with bedroom/bathroom Home Equipment: Rolling Walker (2 wheels);Rollator (4 wheels);Toilet riser      Prior Function Prior Level of Function : Independent/Modified Independent                     Hand Dominance   Dominant Hand: Right    Extremity/Trunk Assessment   Upper Extremity Assessment Upper Extremity Assessment: Overall WFL for tasks assessed    Lower Extremity Assessment Lower Extremity Assessment: LLE deficits/detail LLE Deficits / Details: able to flex hip and knee hooklying    Cervical / Trunk Assessment Cervical / Trunk Assessment: Normal  Communication   Communication: No difficulties  Cognition Arousal/Alertness: Awake/alert Behavior During Therapy:  WFL for tasks assessed/performed Overall Cognitive Status: Within Functional Limits for tasks assessed                                          General Comments      Exercises Total Joint Exercises Ankle Circles/Pumps: AROM, Both, 10 reps Quad Sets: AROM, Left, 10 reps Heel Slides: AROM, Left, 10 reps Hip ABduction/ADduction: AROM, Left, 10  reps Long Arc Quad: AROM, Left, 10 reps   Assessment/Plan    PT Assessment Patient needs continued PT services;All further PT needs can be met in the next venue of care  PT Problem List Decreased strength;Decreased range of motion;Decreased mobility;Decreased activity tolerance;Pain       PT Treatment Interventions DME instruction;Functional mobility training;Gait training;Therapeutic activities;Stair training;Therapeutic exercise;Patient/family education    PT Goals (Current goals can be found in the Care Plan section)  Acute Rehab PT Goals Patient Stated Goal: go home PT Goal Formulation: With patient/family Time For Goal Achievement: 12/06/21 Potential to Achieve Goals: Good    Frequency 7X/week     Co-evaluation               AM-PAC PT "6 Clicks" Mobility  Outcome Measure Help needed turning from your back to your side while in a flat bed without using bedrails?: None Help needed moving from lying on your back to sitting on the side of a flat bed without using bedrails?: None Help needed moving to and from a bed to a chair (including a wheelchair)?: A Little Help needed standing up from a chair using your arms (e.g., wheelchair or bedside chair)?: A Little Help needed to walk in hospital room?: A Little Help needed climbing 3-5 steps with a railing? : A Little 6 Click Score: 20    End of Session Equipment Utilized During Treatment: Gait belt Activity Tolerance: Patient tolerated treatment well Patient left: in chair;with call bell/phone within reach Nurse Communication: Mobility status PT Visit Diagnosis: Muscle weakness (generalized) (M62.81);Pain Pain - Right/Left: Left Pain - part of body: Knee    Time: 1002-1038 PT Time Calculation (min) (ACUTE ONLY): 36 min   Charges:   PT Evaluation $PT Eval Low Complexity: 1 Low PT Treatments $Gait Training: 8-22 mins        Blanchard Kelch PT Acute Rehabilitation Services Office [phone] Weekend  [phone]   Rada Hay 11/29/2021, 12:36 PM

## 2021-11-29 NOTE — Progress Notes (Signed)
   Subjective: 1 Day Post-Op Procedure(s) (LRB): TOTAL HIP ARTHROPLASTY ANTERIOR APPROACH (Left) Patient reports pain as mild.   Patient seen in rounds by Dr. Lequita Halt. Patient is well, and has had no acute complaints or problems. Denies chest pain or SOB. No issues overnight. Foley catheter to be removed this AM. We will begin therapy today.  Objective: Vital signs in last 24 hours: Temp:  [97 F (36.1 C)-98.3 F (36.8 C)] 97.9 F (36.6 C) (06/27 0552) Pulse Rate:  [61-103] 70 (06/27 0552) Resp:  [11-20] 17 (06/27 0552) BP: (90-169)/(53-102) 146/88 (06/27 0552) SpO2:  [96 %-100 %] 97 % (06/27 0552) Weight:  [82.6 kg] 82.6 kg (06/26 1211)  Intake/Output from previous day:  Intake/Output Summary (Last 24 hours) at 11/29/2021 0751 Last data filed at 11/29/2021 0656 Gross per 24 hour  Intake 1858.69 ml  Output 1750 ml  Net 108.69 ml     Intake/Output this shift: No intake/output data recorded.  Labs: Recent Labs    11/29/21 0324  HGB 9.1*   Recent Labs    11/29/21 0324  WBC 7.7  RBC 3.27*  HCT 30.0*  PLT 288   Recent Labs    11/29/21 0324  NA 139  K 4.6  CL 107  CO2 23  BUN 14  CREATININE 0.81  GLUCOSE 144*  CALCIUM 8.8*   No results for input(s): "LABPT", "INR" in the last 72 hours.  Exam: General - Patient is Alert and Oriented Extremity - Neurologically intact Neurovascular intact Sensation intact distally Dorsiflexion/Plantar flexion intact Dressing - dressing C/D/I Motor Function - intact, moving foot and toes well on exam.   Past Medical History:  Diagnosis Date   Anemia    Anxiety    Arthritis    Clubbed foot    left   Depression    Duodenal ulcer 10/2016   Family history of adverse reaction to anesthesia    father combative/AMS after anethesia    Fracture of right humerus    hx of    GERD (gastroesophageal reflux disease)    Headache    hx of migraines    History of hiatal hernia    History of kidney stones    Shortness of  breath dyspnea    due to anemia   UTI (urinary tract infection)     Assessment/Plan: 1 Day Post-Op Procedure(s) (LRB): TOTAL HIP ARTHROPLASTY ANTERIOR APPROACH (Left) Principal Problem:   Osteoarthritis of left hip  Estimated body mass index is 31.24 kg/m as calculated from the following:   Height as of this encounter: 5\' 4"  (1.626 m).   Weight as of this encounter: 82.6 kg. Advance diet Up with therapy D/C IV fluids  DVT Prophylaxis - Xarelto Weight bearing as tolerated. Begin therapy.  Plan is to go Home after hospital stay. Plan for discharge with HEP later today if progresses with therapy and meeting goals. Follow-up in the office in 2 weeks.  The PDMP database was reviewed today prior to any opioid medications being prescribed to this patient.  Arther Abbott, PA-C Orthopedic Surgery [phone] 11/29/2021, 7:51 AM

## 2021-12-04 NOTE — Discharge Summary (Signed)
Patient ID: Tiffany Mcclain MRN: [id number] DOB/AGE: [birth year] 76 y.o.  Admit date: 11/28/2021 Discharge date: 11/29/2021  Admission Diagnoses:  Principal Problem:   Osteoarthritis of left hip   Discharge Diagnoses:  Same  Past Medical History:  Diagnosis Date   Anemia    Anxiety    Arthritis    Clubbed foot    left   Depression    Duodenal ulcer 10/2016   Family history of adverse reaction to anesthesia    father combative/AMS after anethesia    Fracture of right humerus    hx of    GERD (gastroesophageal reflux disease)    Headache    hx of migraines    History of hiatal hernia    History of kidney stones    Shortness of breath dyspnea    due to anemia   UTI (urinary tract infection)     Surgeries: Procedure(s): TOTAL HIP ARTHROPLASTY ANTERIOR APPROACH on 11/28/2021   Consultants:   Discharged Condition: Improved  Hospital Course: Tiffany Mcclain is an 76 y.o. female who was admitted 11/28/2021 for operative treatment ofOsteoarthritis of left hip. Patient has severe unremitting pain that affects sleep, daily activities, and work/hobbies. After pre-op clearance the patient was taken to the operating room on 11/28/2021 and underwent  Procedure(s): TOTAL HIP ARTHROPLASTY ANTERIOR APPROACH.    Patient was given perioperative antibiotics:  Anti-infectives (From admission, onward)    Start     Dose/Rate Route Frequency Ordered Stop   11/28/21 2100  ceFAZolin (ANCEF) IVPB 2g/100 mL premix        2 g 200 mL/hr over 30 Minutes Intravenous Every 6 hours 11/28/21 1802 11/29/21 1138   11/28/21 1200  ceFAZolin (ANCEF) IVPB 2g/100 mL premix        2 g 200 mL/hr over 30 Minutes Intravenous On call to O.R. 11/28/21 1157 11/29/21 1133        Patient was given sequential compression devices, early ambulation, and chemoprophylaxis to prevent DVT.  Patient benefited maximally from hospital stay and there were no complications.    Recent vital signs: No data found.    Recent laboratory studies: No results for input(s): "WBC", "HGB", "HCT", "PLT", "NA", "K", "CL", "CO2", "BUN", "CREATININE", "GLUCOSE", "INR", "CALCIUM" in the last 72 hours.  Invalid input(s): "PT", "2"   Discharge Medications:   Allergies as of 11/29/2021       Reactions   Codeine Nausea Only   "clammy feeling"   Other Diarrhea   Red Meat        Medication List     STOP taking these medications    JUICE PLUS FIBRE PO   meloxicam 15 MG tablet Commonly known as: MOBIC   PRESERVISION AREDS 2 PO       TAKE these medications    acetaminophen 650 MG CR tablet Commonly known as: TYLENOL Take 1,300 mg by mouth in the morning and at bedtime.   atorvastatin 10 MG tablet Commonly known as: LIPITOR Take 10 mg by mouth in the morning.   diphenhydrAMINE 25 MG tablet Commonly known as: BENADRYL Take 25 mg by mouth in the morning and at bedtime.   escitalopram 20 MG tablet Commonly known as: LEXAPRO Take 10 mg by mouth every morning.   ferrous sulfate 325 (65 FE) MG tablet Take 325 mg by mouth daily as needed (2-3 days after taking meloxicam).   fluticasone 50 MCG/ACT nasal spray Commonly known as: FLONASE Place 2 sprays into both nostrils daily as needed (sinus issues.).  HYDROcodone-acetaminophen 5-325 MG tablet Commonly known as: NORCO/VICODIN Take 1-2 tablets by mouth every 6 (six) hours as needed for severe pain.   methocarbamol 500 MG tablet Commonly known as: ROBAXIN Take 1 tablet (500 mg total) by mouth every 6 (six) hours as needed for muscle spasms.   rivaroxaban 10 MG Tabs tablet Commonly known as: XARELTO Take 1 tablet (10 mg total) by mouth daily.   rivaroxaban 10 MG Tabs tablet Commonly known as: XARELTO Take 1 tablet (10 mg total) by mouth daily with breakfast. Take for 3 weeks following surgery.   SUMAtriptan 100 MG tablet Commonly known as: IMITREX Take 100 mg by mouth every 2 (two) hours as needed for migraine. May repeat in 2 hours  if headache persists or recurs.   traMADol 50 MG tablet Commonly known as: ULTRAM Take 1 tablet (50 mg total) by mouth every 6 (six) hours as needed for moderate pain.               Discharge Care Instructions  (From admission, onward)           Start     Ordered   11/29/21 0000  Weight bearing as tolerated        11/29/21 0751   11/29/21 0000  Change dressing       Comments: You have an adhesive waterproof bandage over the incision. Leave this in place until your first follow-up appointment. Once you remove this you will not need to place another bandage.   11/29/21 0751            Diagnostic Studies: DG Pelvis Portable  Result Date: 11/28/2021 CLINICAL DATA:  Left hip replacement EXAM: PORTABLE PELVIS 1-2 VIEWS COMPARISON:  Intraoperative hip radiographs from 11/28/2021 at 3:07 p.m. FINDINGS: Frontal projection of the pelvis and hips demonstrates bilateral total hip prostheses in place with expected alignment. The right hip prosthesis was pre-existing and the left hip prosthesis is newly placed. No periprosthetic fracture or acute complicating feature identified. Expected gas in the adjacent soft tissues. Lower lumbar spondylosis is observed. IMPRESSION: 1. Interval left total hip prosthesis placement without complicating feature. Electronically Signed   By: Van Clines M.D.   On: 11/28/2021 16:57   DG HIP UNILAT WITH PELVIS 1V LEFT  Result Date: 11/28/2021 CLINICAL DATA:  Left hip replacement. EXAM: DG HIP (WITH OR WITHOUT PELVIS) 1V*L* COMPARISON:  CT abdomen and pelvis 10/03/2016 FLUOROSCOPY: Fluoroscopy Time: 10 seconds Radiation Exposure Index: 1.3 mGy FINDINGS: Nine intraoperative spot fluoroscopic images are provided during performance of a left total hip arthroplasty. The prosthetic components appear normally positioned without an acute fracture identified. A pre-existing right hip arthroplasty is noted. IMPRESSION: Intraoperative images during left total hip  arthroplasty. Electronically Signed   By: Logan Bores M.D.   On: 11/28/2021 16:10   DG C-Arm 1-60 Min-No Report  Result Date: 11/28/2021 Fluoroscopy was utilized by the requesting physician.  No radiographic interpretation.   DG C-Arm 1-60 Min-No Report  Result Date: 11/28/2021 Fluoroscopy was utilized by the requesting physician.  No radiographic interpretation.    Disposition: Discharge disposition: 01-Home or Self Care       Discharge Instructions     Call MD / Call 911   Complete by: As directed    If you experience chest pain or shortness of breath, CALL 911 and be transported to the hospital emergency room.  If you develope a fever above 101 F, pus (white drainage) or increased drainage or redness at the wound, or  calf pain, call your surgeon's office.   Change dressing   Complete by: As directed    You have an adhesive waterproof bandage over the incision. Leave this in place until your first follow-up appointment. Once you remove this you will not need to place another bandage.   Constipation Prevention   Complete by: As directed    Drink plenty of fluids.  Prune juice may be helpful.  You may use a stool softener, such as Colace (over the counter) 100 mg twice a day.  Use MiraLax (over the counter) for constipation as needed.   Diet - low sodium heart healthy   Complete by: As directed    Do not sit on low chairs, stoools or toilet seats, as it may be difficult to get up from low surfaces   Complete by: As directed    Driving restrictions   Complete by: As directed    No driving for two weeks   Post-operative opioid taper instructions:   Complete by: As directed    POST-OPERATIVE OPIOID TAPER INSTRUCTIONS: It is important to wean off of your opioid medication as soon as possible. If you do not need pain medication after your surgery it is ok to stop day one. Opioids include: Codeine, Hydrocodone(Norco, Vicodin), Oxycodone(Percocet, oxycontin) and hydromorphone  amongst others.  Long term and even short term use of opiods can cause: Increased pain response Dependence Constipation Depression Respiratory depression And more.  Withdrawal symptoms can include Flu like symptoms Nausea, vomiting And more Techniques to manage these symptoms Hydrate well Eat regular healthy meals Stay active Use relaxation techniques(deep breathing, meditating, yoga) Do Not substitute Alcohol to help with tapering If you have been on opioids for less than two weeks and do not have pain than it is ok to stop all together.  Plan to wean off of opioids This plan should start within one week post op of your joint replacement. Maintain the same interval or time between taking each dose and first decrease the dose.  Cut the total daily intake of opioids by one tablet each day Next start to increase the time between doses. The last dose that should be eliminated is the evening dose.      TED hose   Complete by: As directed    Use stockings (TED hose) for three weeks on both leg(s).  You may remove them at night for sleeping.   Weight bearing as tolerated   Complete by: As directed         Follow-up Information     Aluisio, Pilar Plate, MD. Schedule an appointment as soon as possible for a visit in 2 week(s).   Specialty: Orthopedic Surgery Contact information: [address] [place] [address] [phone]                  Signed: Theresa Duty 12/04/2021, 9:26 PM

## 2022-08-02 ENCOUNTER — Telehealth: Payer: Self-pay | Admitting: *Deleted

## 2022-08-02 NOTE — Telephone Encounter (Signed)
Nurse placed call to patient to review appointment details for upcoming new hematology consultation visit. No answer, left vm.

## 2022-08-03 ENCOUNTER — Encounter: Payer: Self-pay | Admitting: Internal Medicine

## 2022-08-03 ENCOUNTER — Inpatient Hospital Stay: Payer: Medicare HMO | Attending: Internal Medicine | Admitting: Internal Medicine

## 2022-08-03 ENCOUNTER — Inpatient Hospital Stay: Payer: Medicare HMO

## 2022-08-03 VITALS — BP 165/99 | HR 98 | Temp 97.8°F | Resp 20 | Wt 183.8 lb

## 2022-08-03 DIAGNOSIS — K269 Duodenal ulcer, unspecified as acute or chronic, without hemorrhage or perforation: Secondary | ICD-10-CM

## 2022-08-03 DIAGNOSIS — Z90710 Acquired absence of both cervix and uterus: Secondary | ICD-10-CM | POA: Insufficient documentation

## 2022-08-03 DIAGNOSIS — D509 Iron deficiency anemia, unspecified: Secondary | ICD-10-CM | POA: Insufficient documentation

## 2022-08-03 DIAGNOSIS — D500 Iron deficiency anemia secondary to blood loss (chronic): Secondary | ICD-10-CM

## 2022-08-03 DIAGNOSIS — Z8719 Personal history of other diseases of the digestive system: Secondary | ICD-10-CM | POA: Insufficient documentation

## 2022-08-03 DIAGNOSIS — Z87891 Personal history of nicotine dependence: Secondary | ICD-10-CM | POA: Insufficient documentation

## 2022-08-03 NOTE — Progress Notes (Signed)
Patient has no concerns expect her iron level.

## 2022-08-03 NOTE — Progress Notes (Signed)
[place]  [phone] [phone]  ID: Tiffany Mcclain OB: [birth year]  MR#: [id number]  [id number]  Patient Care Team: Albina Billet, MD as PCP - General (Internal Medicine)  REFERRING PROVIDER: Dr. Hall Busing  REASON FOR REFERRAL: Iron deficiency anemia  HPI: Tiffany Mcclain is a 77 y.o. female with past medical history of duodenal ulcer, GERD, anxiety, anemia was referred to hematology for management of iron deficiency anemia.  Patient had routine blood work with her primary.  Labs reviewed from 07/24/2022.  Iron 19, saturation 4%.  Ferritin not available.  Hemoglobin 9.4.  She has history of iron deficiency anemia back in 2018.  She was admitted in 2018 for hemoglobin of 5.5 and received 3 units of PRBC.  Patient had been taking NSAIDs for her neck and back pain.  EGD showed gastritis and 1 nonbleeding duodenal ulcer.  Colonoscopy showed multiple polyps with path tubular adenoma.  She received IV iron also.  She can get out of breath on exertion which has been progressive.  Denies NSAIDs use.  She has some melanotic stools on and off.  REVIEW OF SYSTEMS:   ROS  As per HPI. Otherwise, a complete review of systems is negative.  PAST MEDICAL HISTORY: Past Medical History:  Diagnosis Date   Anemia    Anxiety    Arthritis    Clubbed foot    left   Depression    Duodenal ulcer 10/2016   Family history of adverse reaction to anesthesia    father combative/AMS after anethesia    Fracture of right humerus    hx of    GERD (gastroesophageal reflux disease)    Headache    hx of migraines    History of hiatal hernia    History of kidney stones    Shortness of breath dyspnea    due to anemia   UTI (urinary tract infection)     PAST SURGICAL HISTORY: Past Surgical History:  Procedure Laterality Date   ABDOMINAL HYSTERECTOMY  1980   COLONOSCOPY WITH PROPOFOL N/A 11/20/2016   Procedure: COLONOSCOPY WITH PROPOFOL;  Surgeon: Lucilla Lame, MD;  Location: [facility];  Service: Endoscopy;  Laterality: N/A;   CYSTOSCOPY W/ RETROGRADES Right 10/18/2016   Procedure: CYSTOSCOPY WITH RETROGRADE PYELOGRAM;  Surgeon: Nickie Retort, MD;  Location: [facility];  Service: Urology;  Laterality: Right;   CYSTOSCOPY W/ URETERAL STENT PLACEMENT Right 10/18/2016   Procedure: CYSTOSCOPY WITH STENT REPLACEMENT;  Surgeon: Nickie Retort, MD;  Location: [facility];  Service: Urology;  Laterality: Right;   CYSTOSCOPY WITH STENT PLACEMENT Right 10/03/2016   Procedure: CYSTOSCOPY WITH STENT PLACEMENT;  Surgeon: Cleon Gustin, MD;  Location: [facility];  Service: Urology;  Laterality: Right;   ESOPHAGOGASTRODUODENOSCOPY N/A 10/05/2016   Procedure: ESOPHAGOGASTRODUODENOSCOPY (EGD);  Surgeon: Lucilla Lame, MD;  Location: [facility];  Service: Endoscopy;  Laterality: N/A;   EYE SURGERY  2023   POLYPECTOMY  11/20/2016   Procedure: POLYPECTOMY;  Surgeon: Lucilla Lame, MD;  Location: [facility];  Service: Endoscopy;;   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   Procedure: RIGHT TOTAL HIP ARTHROPLASTY ANTERIOR APPROACH;  Surgeon: Gearlean Alf, MD;  Location: [facility];  Service: Orthopedics;  Laterality: Right;   TOTAL HIP ARTHROPLASTY Left 11/28/2021   Procedure: TOTAL HIP ARTHROPLASTY ANTERIOR APPROACH;  Surgeon: Gaynelle Arabian, MD;  Location: [facility];  Service: Orthopedics;  Laterality: Left;   URETEROSCOPY Right 10/18/2016   Procedure: URETEROSCOPY;  Surgeon: Nickie Retort,  MD;  Location: [facility];  Service: Urology;  Laterality: Right;    FAMILY HISTORY: Family History  Problem Relation Age of Onset   Hypertension Mother    CAD Neg Hx     HEALTH MAINTENANCE: Social History   Tobacco Use   Smoking status: Former    Packs/day: 0.00    Years: 2.00    Total pack years: 0.00    Types: Cigarettes    Quit date: 1974    Years since quitting: 50.1   Smokeless tobacco: Never   Tobacco comments:    smoked as  teenager - 1 yr  Vaping Use   Vaping Use: Never used  Substance Use Topics   Alcohol use: Yes    Alcohol/week: 7.0 standard drinks of alcohol    Types: 7 Glasses of wine per week    Comment: wine daily   Drug use: No     Allergies  Allergen Reactions   Codeine Nausea Only    "clammy feeling"   Other Diarrhea    Red Meat    Current Outpatient Medications  Medication Sig Dispense Refill   acetaminophen (TYLENOL) 650 MG CR tablet Take 1,300 mg by mouth in the morning and at bedtime.     atorvastatin (LIPITOR) 10 MG tablet Take 10 mg by mouth in the morning.     escitalopram (LEXAPRO) 20 MG tablet Take 10 mg by mouth every morning.     ondansetron (ZOFRAN-ODT) 4 MG disintegrating tablet Take 4 mg by mouth every 8 (eight) hours as needed.     SUMAtriptan (IMITREX) 100 MG tablet Take 100 mg by mouth every 2 (two) hours as needed for migraine. May repeat in 2 hours if headache persists or recurs.     diphenhydrAMINE (BENADRYL) 25 MG tablet Take 25 mg by mouth in the morning and at bedtime. (Patient not taking: Reported on 08/03/2022)     ferrous sulfate 325 (65 FE) MG tablet Take 325 mg by mouth daily as needed (2-3 days after taking meloxicam). (Patient not taking: Reported on 08/03/2022)     fluticasone (FLONASE) 50 MCG/ACT nasal spray Place 2 sprays into both nostrils daily as needed (sinus issues.). (Patient not taking: Reported on 08/03/2022)     HYDROcodone-acetaminophen (NORCO/VICODIN) 5-325 MG tablet Take 1-2 tablets by mouth every 6 (six) hours as needed for severe pain. (Patient not taking: Reported on 08/03/2022) 42 tablet 0   methocarbamol (ROBAXIN) 500 MG tablet Take 1 tablet (500 mg total) by mouth every 6 (six) hours as needed for muscle spasms. (Patient not taking: Reported on 08/03/2022) 40 tablet 0   rivaroxaban (XARELTO) 10 MG TABS tablet Take 1 tablet (10 mg total) by mouth daily. (Patient not taking: Reported on 08/03/2022) 20 tablet 0   rivaroxaban (XARELTO) 10 MG TABS  tablet Take 1 tablet (10 mg total) by mouth daily with breakfast. Take for 3 weeks following surgery. (Patient not taking: Reported on 08/03/2022) 30 tablet 0   traMADol (ULTRAM) 50 MG tablet Take 1 tablet (50 mg total) by mouth every 6 (six) hours as needed for moderate pain. (Patient not taking: Reported on 08/03/2022) 20 tablet 0   No current facility-administered medications for this visit.    OBJECTIVE: Vitals:   08/03/22 1439  BP: (!) 165/99  Pulse: 98  Resp: 20  Temp: 97.8 F (36.6 C)  SpO2: 100%     Body mass index is 31.55 kg/m.      General: Well-developed, well-nourished, no acute distress. Eyes: Pink conjunctiva, anicteric  sclera. HEENT: Normocephalic, moist mucous membranes, clear oropharnyx. Lungs: Clear to auscultation bilaterally. Heart: Regular rate and rhythm. No rubs, murmurs, or gallops. Abdomen: Soft, nontender, nondistended. No organomegaly noted, normoactive bowel sounds. Musculoskeletal: No edema, cyanosis, or clubbing. Neuro: Alert, answering all questions appropriately. Cranial nerves grossly intact. Skin: No rashes or petechiae noted. Psych: Normal affect. Lymphatics: No cervical, calvicular, axillary or inguinal LAD.   LAB RESULTS:  Lab Results  Component Value Date   NA 139 11/29/2021   K 4.6 11/29/2021   CL 107 11/29/2021   CO2 23 11/29/2021   GLUCOSE 144 (H) 11/29/2021   BUN 14 11/29/2021   CREATININE 0.81 11/29/2021   CALCIUM 8.8 (L) 11/29/2021   PROT 6.1 (L) 10/04/2016   ALBUMIN 2.9 (L) 10/04/2016   AST 16 10/04/2016   ALT 14 10/04/2016   ALKPHOS 76 10/04/2016   BILITOT 0.7 10/04/2016   GFRNONAA >60 11/29/2021   GFRAA >60 10/04/2016    Lab Results  Component Value Date   WBC 7.7 11/29/2021   NEUTROABS 3.4 10/23/2016   HGB 9.1 (L) 11/29/2021   HCT 30.0 (L) 11/29/2021   MCV 91.7 11/29/2021   PLT 288 11/29/2021    Lab Results  Component Value Date   TIBC 448 10/03/2016   FERRITIN 12 10/03/2016   IRONPCTSAT 1 (L) 10/03/2016      STUDIES: No results found.  ASSESSMENT AND PLAN:   Tiffany Mcclain is a 77 y.o. female with pmh of duodenal ulcer, GERD, anxiety, anemia was referred to hematology for management of iron deficiency anemia.  # Iron deficiency anemia # History of duodenal ulcer and gastritis -Unable to tolerate oral iron due to GI upset -In 2018, was admitted for hemoglobin of 5.5 and treated with PRBC transfusions and IV iron infusions. EGD showed gastritis and 1 nonbleeding duodenal ulcer.  Colonoscopy showed multiple polyps with path tubular adenoma.  She received IV iron also. -Discussed with the patient about IV Venofer 200 mg weekly x 5 doses.  Low but potential risk of anaphylactic reaction was discussed. -Referral to GI as she is due for colonoscopy.  RTC in 3 months for MD visit, labs IV Venofer weekly x 5  Patient expressed understanding and was in agreement with this plan. She also understands that She can call clinic at any time with any questions, concerns, or complaints.   I spent a total of 45 minutes reviewing chart data, face-to-face evaluation with the patient, counseling and coordination of care as detailed above.  Tiffany Canary, MD   08/03/2022 4:13 PM

## 2022-08-11 ENCOUNTER — Inpatient Hospital Stay: Payer: Medicare HMO | Attending: Internal Medicine

## 2022-08-11 VITALS — BP 162/86 | HR 80 | Temp 98.8°F | Resp 18

## 2022-08-11 DIAGNOSIS — D509 Iron deficiency anemia, unspecified: Secondary | ICD-10-CM | POA: Diagnosis not present

## 2022-08-11 DIAGNOSIS — K269 Duodenal ulcer, unspecified as acute or chronic, without hemorrhage or perforation: Secondary | ICD-10-CM

## 2022-08-11 DIAGNOSIS — D500 Iron deficiency anemia secondary to blood loss (chronic): Secondary | ICD-10-CM

## 2022-08-11 MED ORDER — SODIUM CHLORIDE 0.9 % IV SOLN
200.0000 mg | Freq: Once | INTRAVENOUS | Status: AC
  Administered 2022-08-11: 200 mg via INTRAVENOUS
  Filled 2022-08-11: qty 200

## 2022-08-11 MED ORDER — SODIUM CHLORIDE 0.9 % IV SOLN
Freq: Once | INTRAVENOUS | Status: AC
  Filled 2022-08-11: qty 250

## 2022-08-18 ENCOUNTER — Inpatient Hospital Stay: Payer: Medicare HMO

## 2022-08-18 VITALS — BP 127/82 | HR 81 | Temp 98.4°F | Resp 18

## 2022-08-18 DIAGNOSIS — K269 Duodenal ulcer, unspecified as acute or chronic, without hemorrhage or perforation: Secondary | ICD-10-CM

## 2022-08-18 DIAGNOSIS — D500 Iron deficiency anemia secondary to blood loss (chronic): Secondary | ICD-10-CM

## 2022-08-18 DIAGNOSIS — D509 Iron deficiency anemia, unspecified: Secondary | ICD-10-CM | POA: Diagnosis not present

## 2022-08-18 MED ORDER — SODIUM CHLORIDE 0.9 % IV SOLN
Freq: Once | INTRAVENOUS | Status: AC
  Filled 2022-08-18: qty 250

## 2022-08-18 MED ORDER — SODIUM CHLORIDE 0.9 % IV SOLN
200.0000 mg | Freq: Once | INTRAVENOUS | Status: AC
  Administered 2022-08-18: 200 mg via INTRAVENOUS
  Filled 2022-08-18: qty 200

## 2022-08-18 NOTE — Patient Instructions (Signed)

## 2022-08-25 ENCOUNTER — Inpatient Hospital Stay: Payer: Medicare HMO

## 2022-08-25 VITALS — BP 126/70 | HR 83 | Temp 98.6°F | Resp 16

## 2022-08-25 DIAGNOSIS — D500 Iron deficiency anemia secondary to blood loss (chronic): Secondary | ICD-10-CM

## 2022-08-25 DIAGNOSIS — K269 Duodenal ulcer, unspecified as acute or chronic, without hemorrhage or perforation: Secondary | ICD-10-CM

## 2022-08-25 DIAGNOSIS — D509 Iron deficiency anemia, unspecified: Secondary | ICD-10-CM | POA: Diagnosis not present

## 2022-08-25 MED ORDER — SODIUM CHLORIDE 0.9 % IV SOLN
200.0000 mg | Freq: Once | INTRAVENOUS | Status: AC
  Administered 2022-08-25: 200 mg via INTRAVENOUS
  Filled 2022-08-25: qty 200

## 2022-08-25 MED ORDER — SODIUM CHLORIDE 0.9 % IV SOLN
Freq: Once | INTRAVENOUS | Status: AC
  Filled 2022-08-25: qty 250

## 2022-08-25 NOTE — Patient Instructions (Signed)

## 2022-09-01 ENCOUNTER — Inpatient Hospital Stay: Payer: Medicare HMO

## 2022-09-01 VITALS — BP 153/68 | HR 82 | Temp 97.3°F | Resp 18

## 2022-09-01 DIAGNOSIS — K269 Duodenal ulcer, unspecified as acute or chronic, without hemorrhage or perforation: Secondary | ICD-10-CM

## 2022-09-01 DIAGNOSIS — D509 Iron deficiency anemia, unspecified: Secondary | ICD-10-CM | POA: Diagnosis not present

## 2022-09-01 DIAGNOSIS — D500 Iron deficiency anemia secondary to blood loss (chronic): Secondary | ICD-10-CM

## 2022-09-01 MED ORDER — SODIUM CHLORIDE 0.9 % IV SOLN
200.0000 mg | Freq: Once | INTRAVENOUS | Status: AC
  Administered 2022-09-01: 200 mg via INTRAVENOUS
  Filled 2022-09-01: qty 200

## 2022-09-01 MED ORDER — SODIUM CHLORIDE 0.9 % IV SOLN
Freq: Once | INTRAVENOUS | Status: AC
  Filled 2022-09-01: qty 250

## 2022-09-01 NOTE — Patient Instructions (Signed)

## 2022-09-04 ENCOUNTER — Encounter: Payer: Self-pay | Admitting: Internal Medicine

## 2022-09-08 ENCOUNTER — Inpatient Hospital Stay: Payer: Medicare HMO | Attending: Internal Medicine

## 2022-09-08 VITALS — BP 144/85 | HR 93 | Temp 97.2°F | Resp 18

## 2022-09-08 DIAGNOSIS — K269 Duodenal ulcer, unspecified as acute or chronic, without hemorrhage or perforation: Secondary | ICD-10-CM

## 2022-09-08 DIAGNOSIS — D509 Iron deficiency anemia, unspecified: Secondary | ICD-10-CM | POA: Diagnosis present

## 2022-09-08 DIAGNOSIS — D500 Iron deficiency anemia secondary to blood loss (chronic): Secondary | ICD-10-CM

## 2022-09-08 MED ORDER — SODIUM CHLORIDE 0.9 % IV SOLN
200.0000 mg | Freq: Once | INTRAVENOUS | Status: AC
  Administered 2022-09-08: 200 mg via INTRAVENOUS
  Filled 2022-09-08: qty 200

## 2022-09-08 MED ORDER — SODIUM CHLORIDE 0.9 % IV SOLN
Freq: Once | INTRAVENOUS | Status: AC
  Filled 2022-09-08: qty 250

## 2022-09-08 NOTE — Patient Instructions (Signed)

## 2022-09-13 ENCOUNTER — Telehealth: Payer: Self-pay | Admitting: Gastroenterology

## 2022-09-13 NOTE — Telephone Encounter (Signed)
error 

## 2022-09-13 NOTE — Telephone Encounter (Signed)
Pt returned call to schedule colonoscopy  would like a call back I explained to her the ref was for a consult  she insisted that it was for a colonoscopy

## 2022-11-02 ENCOUNTER — Inpatient Hospital Stay (HOSPITAL_BASED_OUTPATIENT_CLINIC_OR_DEPARTMENT_OTHER): Payer: Medicare HMO | Admitting: Internal Medicine

## 2022-11-02 ENCOUNTER — Inpatient Hospital Stay: Payer: Medicare HMO | Attending: Internal Medicine

## 2022-11-02 ENCOUNTER — Encounter: Payer: Self-pay | Admitting: Internal Medicine

## 2022-11-02 ENCOUNTER — Other Ambulatory Visit: Payer: Self-pay

## 2022-11-02 VITALS — BP 143/85 | HR 96 | Temp 98.6°F | Wt 181.7 lb

## 2022-11-02 DIAGNOSIS — Z90710 Acquired absence of both cervix and uterus: Secondary | ICD-10-CM | POA: Diagnosis not present

## 2022-11-02 DIAGNOSIS — D7589 Other specified diseases of blood and blood-forming organs: Secondary | ICD-10-CM | POA: Insufficient documentation

## 2022-11-02 DIAGNOSIS — Z8719 Personal history of other diseases of the digestive system: Secondary | ICD-10-CM | POA: Diagnosis not present

## 2022-11-02 DIAGNOSIS — Z87891 Personal history of nicotine dependence: Secondary | ICD-10-CM | POA: Insufficient documentation

## 2022-11-02 DIAGNOSIS — D509 Iron deficiency anemia, unspecified: Secondary | ICD-10-CM | POA: Diagnosis present

## 2022-11-02 DIAGNOSIS — D500 Iron deficiency anemia secondary to blood loss (chronic): Secondary | ICD-10-CM | POA: Diagnosis not present

## 2022-11-02 LAB — HEPATIC FUNCTION PANEL
ALT: 22 U/L (ref 0–44)
AST: 29 U/L (ref 15–41)
Albumin: 3.6 g/dL (ref 3.5–5.0)
Alkaline Phosphatase: 85 U/L (ref 38–126)
Bilirubin, Direct: 0.1 mg/dL (ref 0.0–0.2)
Indirect Bilirubin: 0.5 mg/dL (ref 0.3–0.9)
Total Bilirubin: 0.6 mg/dL (ref 0.3–1.2)
Total Protein: 6.7 g/dL (ref 6.5–8.1)

## 2022-11-02 LAB — CBC WITH DIFFERENTIAL/PLATELET
Abs Immature Granulocytes: 0.03 10*3/uL (ref 0.00–0.07)
Basophils Absolute: 0 10*3/uL (ref 0.0–0.1)
Basophils Relative: 0 %
Eosinophils Absolute: 0.1 10*3/uL (ref 0.0–0.5)
Eosinophils Relative: 1 %
HCT: 39.2 % (ref 36.0–46.0)
Hemoglobin: 12.6 g/dL (ref 12.0–15.0)
Immature Granulocytes: 0 %
Lymphocytes Relative: 9 %
Lymphs Abs: 0.8 10*3/uL (ref 0.7–4.0)
MCH: 32.9 pg (ref 26.0–34.0)
MCHC: 32.1 g/dL (ref 30.0–36.0)
MCV: 102.3 fL — ABNORMAL HIGH (ref 80.0–100.0)
Monocytes Absolute: 0.7 10*3/uL (ref 0.1–1.0)
Monocytes Relative: 8 %
Neutro Abs: 7.1 10*3/uL (ref 1.7–7.7)
Neutrophils Relative %: 82 %
Platelets: 205 10*3/uL (ref 150–400)
RBC: 3.83 MIL/uL — ABNORMAL LOW (ref 3.87–5.11)
RDW: 17.8 % — ABNORMAL HIGH (ref 11.5–15.5)
WBC: 8.8 10*3/uL (ref 4.0–10.5)
nRBC: 0 % (ref 0.0–0.2)

## 2022-11-02 LAB — FERRITIN: Ferritin: 39 ng/mL (ref 11–307)

## 2022-11-02 LAB — IRON AND TIBC
Iron: 20 ug/dL — ABNORMAL LOW (ref 28–170)
Saturation Ratios: 5 % — ABNORMAL LOW (ref 10.4–31.8)
TIBC: 375 ug/dL (ref 250–450)
UIBC: 355 ug/dL

## 2022-11-02 NOTE — Progress Notes (Signed)
Patient has a kidney stone currently, and she thinks that most of her symptoms she is experiencing right now is being caused from that. Pt says since she has stopped taking her iron pills that she feels a lot better.

## 2022-11-02 NOTE — Progress Notes (Signed)
[facility]  Telephone:(336) [phone] [phone]) [phone]  ID: Tiffany Mcclain OB: Sep 20, 1945  MR#: [id number]  AOZ#:[id number]  Patient Care Team: Jaclyn Shaggy, MD as PCP - General (Internal Medicine)  REFERRING PROVIDER: Dr. Arlana Pouch  REASON FOR REFERRAL: Iron deficiency anemia  HPI: Tiffany Mcclain is a 77 y.o. female with past medical history of duodenal ulcer, GERD, anxiety, anemia was referred to hematology for management of iron deficiency anemia.  Patient had routine blood work with her primary.  Labs reviewed from 07/24/2022.  Iron 19, saturation 4%.  Ferritin not available.  Hemoglobin 9.4.  She has history of iron deficiency anemia back in 2018.  She was admitted in 2018 for hemoglobin of 5.5 and received 3 units of PRBC.  Patient had been taking NSAIDs for her neck and back pain.  EGD showed gastritis and 1 nonbleeding duodenal ulcer.  Colonoscopy showed multiple polyps with path tubular adenoma.  She received IV iron also.  She can get out of breath on exertion which has been progressive.  Denies NSAIDs use.  She has some melanotic stools on and off.  Interval history Patient was seen today as follow-up labs post iron infusion. Completed IV Venofer 5 doses in April 2024.  She feels significantly better after iron infusions.  Her energy level is good quality of life has improved.  Over the weekend, she did  pass a stone had some blood in her urine.  Denies any bleeding in stool.  Declined GI workup.  REVIEW OF SYSTEMS:   Review of Systems  All other systems reviewed and are negative.   As per HPI. Otherwise, a complete review of systems is negative.  PAST MEDICAL HISTORY: Past Medical History:  Diagnosis Date   Anemia    Anxiety    Arthritis    Clubbed foot    left   Depression    Duodenal ulcer 10/2016   Family history of adverse reaction to anesthesia    father combative/AMS after anethesia    Fracture of right humerus    hx of    GERD  (gastroesophageal reflux disease)    Headache    hx of migraines    History of hiatal hernia    History of kidney stones    Shortness of breath dyspnea    due to anemia   UTI (urinary tract infection)     PAST SURGICAL HISTORY: Past Surgical History:  Procedure Laterality Date   ABDOMINAL HYSTERECTOMY  1980   COLONOSCOPY WITH PROPOFOL N/A 11/20/2016   Procedure: COLONOSCOPY WITH PROPOFOL;  Surgeon: Midge Minium, MD;  Location: [facility];  Service: Endoscopy;  Laterality: N/A;   CYSTOSCOPY W/ RETROGRADES Right 10/18/2016   Procedure: CYSTOSCOPY WITH RETROGRADE PYELOGRAM;  Surgeon: Hildred Laser, MD;  Location: [facility];  Service: Urology;  Laterality: Right;   CYSTOSCOPY W/ URETERAL STENT PLACEMENT Right 10/18/2016   Procedure: CYSTOSCOPY WITH STENT REPLACEMENT;  Surgeon: Hildred Laser, MD;  Location: [facility];  Service: Urology;  Laterality: Right;   CYSTOSCOPY WITH STENT PLACEMENT Right 10/03/2016   Procedure: CYSTOSCOPY WITH STENT PLACEMENT;  Surgeon: Malen Gauze, MD;  Location: [facility];  Service: Urology;  Laterality: Right;   ESOPHAGOGASTRODUODENOSCOPY N/A 10/05/2016   Procedure: ESOPHAGOGASTRODUODENOSCOPY (EGD);  Surgeon: Midge Minium, MD;  Location: [facility] ENDOSCOPY;  Service: Endoscopy;  Laterality: N/A;   EYE SURGERY  2023   POLYPECTOMY  11/20/2016   Procedure: POLYPECTOMY;  Surgeon: Midge Minium, MD;  Location: [facility];  Service:  Endoscopy;;   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   Procedure: RIGHT TOTAL HIP ARTHROPLASTY ANTERIOR APPROACH;  Surgeon: Loanne Drilling, MD;  Location: [facility];  Service: Orthopedics;  Laterality: Right;   TOTAL HIP ARTHROPLASTY Left 11/28/2021   Procedure: TOTAL HIP ARTHROPLASTY ANTERIOR APPROACH;  Surgeon: Ollen Gross, MD;  Location: [facility];  Service: Orthopedics;  Laterality: Left;   URETEROSCOPY Right 10/18/2016   Procedure: URETEROSCOPY;  Surgeon: Hildred Laser, MD;  Location: [facility];   Service: Urology;  Laterality: Right;    FAMILY HISTORY: Family History  Problem Relation Age of Onset   Hypertension Mother    CAD Neg Hx     HEALTH MAINTENANCE: Social History   Tobacco Use   Smoking status: Former    Packs/day: 0.00    Years: 2.00    Additional pack years: 0.00    Total pack years: 0.00    Types: Cigarettes    Quit date: 1974    Years since quitting: 50.4   Smokeless tobacco: Never   Tobacco comments:    smoked as teenager - 1 yr  Vaping Use   Vaping Use: Never used  Substance Use Topics   Alcohol use: Yes    Alcohol/week: 7.0 standard drinks of alcohol    Types: 7 Glasses of wine per week    Comment: wine daily   Drug use: No     Allergies  Allergen Reactions   Codeine Nausea Only    "clammy feeling"   Other Diarrhea    Red Meat    Current Outpatient Medications  Medication Sig Dispense Refill   acetaminophen (TYLENOL) 650 MG CR tablet Take 1,300 mg by mouth in the morning and at bedtime.     atorvastatin (LIPITOR) 10 MG tablet Take 10 mg by mouth in the morning.     escitalopram (LEXAPRO) 20 MG tablet Take 10 mg by mouth every morning.     Multiple Vitamins-Minerals (ICAPS) TABS Take 1 tablet by mouth daily.     ondansetron (ZOFRAN-ODT) 4 MG disintegrating tablet Take 4 mg by mouth every 8 (eight) hours as needed.     diphenhydrAMINE (BENADRYL) 25 MG tablet Take 25 mg by mouth in the morning and at bedtime. (Patient not taking: Reported on 08/03/2022)     ferrous sulfate 325 (65 FE) MG tablet Take 325 mg by mouth daily as needed (2-3 days after taking meloxicam). (Patient not taking: Reported on 08/03/2022)     fluticasone (FLONASE) 50 MCG/ACT nasal spray Place 2 sprays into both nostrils daily as needed (sinus issues.). (Patient not taking: Reported on 08/03/2022)     HYDROcodone-acetaminophen (NORCO/VICODIN) 5-325 MG tablet Take 1-2 tablets by mouth every 6 (six) hours as needed for severe pain. (Patient not taking: Reported on 08/03/2022) 42  tablet 0   methocarbamol (ROBAXIN) 500 MG tablet Take 1 tablet (500 mg total) by mouth every 6 (six) hours as needed for muscle spasms. (Patient not taking: Reported on 08/03/2022) 40 tablet 0   rivaroxaban (XARELTO) 10 MG TABS tablet Take 1 tablet (10 mg total) by mouth daily. (Patient not taking: Reported on 08/03/2022) 20 tablet 0   rivaroxaban (XARELTO) 10 MG TABS tablet Take 1 tablet (10 mg total) by mouth daily with breakfast. Take for 3 weeks following surgery. (Patient not taking: Reported on 08/03/2022) 30 tablet 0   SUMAtriptan (IMITREX) 100 MG tablet Take 100 mg by mouth every 2 (two) hours as needed for migraine. May repeat in 2 hours if headache persists or  recurs. (Patient not taking: Reported on 11/02/2022)     traMADol (ULTRAM) 50 MG tablet Take 1 tablet (50 mg total) by mouth every 6 (six) hours as needed for moderate pain. (Patient not taking: Reported on 08/03/2022) 20 tablet 0   No current facility-administered medications for this visit.    OBJECTIVE: Vitals:   11/02/22 1401  BP: (!) 143/85  Pulse: 96  Temp: 98.6 F (37 C)  SpO2: 98%     Body mass index is 31.19 kg/m.      General: Well-developed, well-nourished, no acute distress. Eyes: Pink conjunctiva, anicteric sclera. HEENT: Normocephalic, moist mucous membranes, clear oropharnyx. Lungs: Clear to auscultation bilaterally. Heart: Regular rate and rhythm. No rubs, murmurs, or gallops. Abdomen: Soft, nontender, nondistended. No organomegaly noted, normoactive bowel sounds. Musculoskeletal: No edema, cyanosis, or clubbing. Neuro: Alert, answering all questions appropriately. Cranial nerves grossly intact. Skin: No rashes or petechiae noted. Psych: Normal affect. Lymphatics: No cervical, calvicular, axillary or inguinal LAD.   LAB RESULTS:  Lab Results  Component Value Date   NA 139 11/29/2021   K 4.6 11/29/2021   CL 107 11/29/2021   CO2 23 11/29/2021   GLUCOSE 144 (H) 11/29/2021   BUN 14 11/29/2021    CREATININE 0.81 11/29/2021   CALCIUM 8.8 (L) 11/29/2021   PROT 6.1 (L) 10/04/2016   ALBUMIN 2.9 (L) 10/04/2016   AST 16 10/04/2016   ALT 14 10/04/2016   ALKPHOS 76 10/04/2016   BILITOT 0.7 10/04/2016   GFRNONAA >60 11/29/2021   GFRAA >60 10/04/2016    Lab Results  Component Value Date   WBC 8.8 11/02/2022   NEUTROABS 7.1 11/02/2022   HGB 12.6 11/02/2022   HCT 39.2 11/02/2022   MCV 102.3 (H) 11/02/2022   PLT 205 11/02/2022    Lab Results  Component Value Date   TIBC 448 10/03/2016   FERRITIN 12 10/03/2016   IRONPCTSAT 1 (L) 10/03/2016     STUDIES: No results found.  ASSESSMENT AND PLAN:   Tiffany Mcclain is a 77 y.o. female with pmh of duodenal ulcer, GERD, anxiety, anemia was referred to hematology for management of iron deficiency anemia.  # Iron deficiency anemia # History of duodenal ulcer and gastritis -Unable to tolerate oral iron due to GI upset -In 2018, was admitted for hemoglobin of 5.5 and treated with PRBC transfusions and IV iron infusions. EGD showed gastritis and 1 nonbleeding duodenal ulcer.  Colonoscopy showed multiple polyps with path tubular adenoma.   -Completed IV Venofer 200 mg x 5 doses on 09/18/2022.  Hemoglobin has improved to 12.6.  Iron studies are pending.  Patient feels significantly better after iron infusions.  Declined GI workup.  # Macrocytosis without anemia -I will check vitamin B12 and folic acid next visit. -Add on LFTs today if possible  Orders Placed This Encounter  Procedures   CBC with Differential/Platelet   Comprehensive metabolic panel   Vitamin B12   Folate   Iron and TIBC   Ferritin   Hepatic function panel     RTC in 5 months for MD visit, labs, possible iron infusion  Patient expressed understanding and was in agreement with this plan. She also understands that She can call clinic at any time with any questions, concerns, or complaints.   I spent a total of 25 minutes reviewing chart data, face-to-face  evaluation with the patient, counseling and coordination of care as detailed above.  Michaelyn Barter, MD   11/02/2022 2:39 PM

## 2023-01-24 ENCOUNTER — Encounter: Payer: Self-pay | Admitting: Internal Medicine

## 2023-02-15 ENCOUNTER — Encounter: Payer: Self-pay | Admitting: Internal Medicine

## 2023-02-15 ENCOUNTER — Inpatient Hospital Stay (HOSPITAL_BASED_OUTPATIENT_CLINIC_OR_DEPARTMENT_OTHER): Payer: Medicare HMO | Admitting: Internal Medicine

## 2023-02-15 ENCOUNTER — Inpatient Hospital Stay: Payer: Medicare HMO

## 2023-02-15 ENCOUNTER — Inpatient Hospital Stay: Payer: Medicare HMO | Attending: Internal Medicine

## 2023-02-15 VITALS — BP 176/93 | HR 71 | Temp 97.0°F | Resp 18

## 2023-02-15 VITALS — BP 160/98 | HR 87 | Resp 18 | Ht 64.0 in | Wt 179.0 lb

## 2023-02-15 DIAGNOSIS — D500 Iron deficiency anemia secondary to blood loss (chronic): Secondary | ICD-10-CM

## 2023-02-15 DIAGNOSIS — Z23 Encounter for immunization: Secondary | ICD-10-CM

## 2023-02-15 DIAGNOSIS — Z8711 Personal history of peptic ulcer disease: Secondary | ICD-10-CM | POA: Insufficient documentation

## 2023-02-15 DIAGNOSIS — Z87891 Personal history of nicotine dependence: Secondary | ICD-10-CM | POA: Diagnosis not present

## 2023-02-15 DIAGNOSIS — Z8719 Personal history of other diseases of the digestive system: Secondary | ICD-10-CM | POA: Diagnosis not present

## 2023-02-15 DIAGNOSIS — K269 Duodenal ulcer, unspecified as acute or chronic, without hemorrhage or perforation: Secondary | ICD-10-CM

## 2023-02-15 DIAGNOSIS — D509 Iron deficiency anemia, unspecified: Secondary | ICD-10-CM | POA: Insufficient documentation

## 2023-02-15 DIAGNOSIS — D7589 Other specified diseases of blood and blood-forming organs: Secondary | ICD-10-CM | POA: Diagnosis not present

## 2023-02-15 LAB — CBC WITH DIFFERENTIAL/PLATELET
Abs Immature Granulocytes: 0.02 10*3/uL (ref 0.00–0.07)
Basophils Absolute: 0 10*3/uL (ref 0.0–0.1)
Basophils Relative: 1 %
Eosinophils Absolute: 0.2 10*3/uL (ref 0.0–0.5)
Eosinophils Relative: 3 %
HCT: 43.6 % (ref 36.0–46.0)
Hemoglobin: 13.9 g/dL (ref 12.0–15.0)
Immature Granulocytes: 0 %
Lymphocytes Relative: 18 %
Lymphs Abs: 1.1 10*3/uL (ref 0.7–4.0)
MCH: 32.4 pg (ref 26.0–34.0)
MCHC: 31.9 g/dL (ref 30.0–36.0)
MCV: 101.6 fL — ABNORMAL HIGH (ref 80.0–100.0)
Monocytes Absolute: 0.4 10*3/uL (ref 0.1–1.0)
Monocytes Relative: 7 %
Neutro Abs: 4.3 10*3/uL (ref 1.7–7.7)
Neutrophils Relative %: 71 %
Platelets: 265 10*3/uL (ref 150–400)
RBC: 4.29 MIL/uL (ref 3.87–5.11)
RDW: 13.9 % (ref 11.5–15.5)
WBC: 6 10*3/uL (ref 4.0–10.5)
nRBC: 0 % (ref 0.0–0.2)

## 2023-02-15 LAB — COMPREHENSIVE METABOLIC PANEL
ALT: 17 U/L (ref 0–44)
AST: 22 U/L (ref 15–41)
Albumin: 4.2 g/dL (ref 3.5–5.0)
Alkaline Phosphatase: 76 U/L (ref 38–126)
Anion gap: 8 (ref 5–15)
BUN: 19 mg/dL (ref 8–23)
CO2: 24 mmol/L (ref 22–32)
Calcium: 9.4 mg/dL (ref 8.9–10.3)
Chloride: 105 mmol/L (ref 98–111)
Creatinine, Ser: 0.93 mg/dL (ref 0.44–1.00)
GFR, Estimated: >60 mL/min (ref >60)
Glucose, Bld: 99 mg/dL (ref 70–99)
Potassium: 4.7 mmol/L (ref 3.5–5.1)
Sodium: 137 mmol/L (ref 135–145)
Total Bilirubin: 0.5 mg/dL (ref 0.3–1.2)
Total Protein: 7.4 g/dL (ref 6.5–8.1)

## 2023-02-15 LAB — IRON AND TIBC
Iron: 73 ug/dL (ref 28–170)
Saturation Ratios: 15 % (ref 10.4–31.8)
TIBC: 483 ug/dL — ABNORMAL HIGH (ref 250–450)
UIBC: 410 ug/dL

## 2023-02-15 LAB — VITAMIN B12: Vitamin B-12: 558 pg/mL (ref 180–914)

## 2023-02-15 LAB — FERRITIN: Ferritin: 11 ng/mL (ref 11–307)

## 2023-02-15 LAB — FOLATE: Folate: 7.4 ng/mL (ref >5.9)

## 2023-02-15 MED ORDER — MECLIZINE HCL 12.5 MG PO TABS: 12.5000 mg | ORAL_TABLET | Freq: Three times a day (TID) | ORAL | 0 refills | Status: AC | PRN

## 2023-02-15 MED ORDER — INFLUENZA VAC A&B SURF ANT ADJ 0.5 ML IM SUSY
0.5000 mL | PREFILLED_SYRINGE | Freq: Once | INTRAMUSCULAR | Status: AC
  Administered 2023-02-15: 0.5 mL via INTRAMUSCULAR
  Filled 2023-02-15: qty 0.5

## 2023-02-15 MED ORDER — SODIUM CHLORIDE 0.9 % IV SOLN
Freq: Once | INTRAVENOUS | Status: AC
  Filled 2023-02-15: qty 250

## 2023-02-15 MED ORDER — SODIUM CHLORIDE 0.9 % IV SOLN
200.0000 mg | Freq: Once | INTRAVENOUS | Status: AC
  Administered 2023-02-15: 200 mg via INTRAVENOUS
  Filled 2023-02-15: qty 200

## 2023-02-15 NOTE — Progress Notes (Signed)
[facility]  Telephone:([phone]) [phone] Fax:(336) [phone]  ID: Tiffany Mcclain OB: Sep 29, 1945  MR#: [id number]  AOZ#:[id number]  Patient Care Team: Jaclyn Shaggy, MD as PCP - General (Internal Medicine) Michaelyn Barter, MD as Consulting Physician (Oncology)  REFERRING PROVIDER: Dr. Arlana Pouch  REASON FOR REFERRAL: Iron deficiency anemia  HPI: Tiffany Mcclain is a 77 y.o. female with past medical history of duodenal ulcer, GERD, anxiety, anemia was referred to hematology for management of iron deficiency anemia.  Patient had routine blood work with her primary.  Labs reviewed from 07/24/2022.  Iron 19, saturation 4%.  Ferritin not available.  Hemoglobin 9.4.  She has history of iron deficiency anemia back in 2018.  She was admitted in 2018 for hemoglobin of 5.5 and received 3 units of PRBC.  Patient had been taking NSAIDs for her neck and back pain.  EGD showed gastritis and 1 nonbleeding duodenal ulcer.  Colonoscopy showed multiple polyps with path tubular adenoma.  She received IV iron also.  She can get out of breath on exertion which has been progressive.  Denies NSAIDs use.  She has some melanotic stools on and off.  Completed IV Venofer 5 doses in April 2024.  Interval history Patient seen today as follow-up for iron deficiency anemia, labs. Reports shortness of breath on exertion and dizziness for past 4 to 5 weeks..  Energy level is low.  Reports her symptoms are same as how she felt beginning of this year prior to doing iron infusions.  After iron infusion she felt remarkably better and all her symptoms have resolved until recently.  Reports intentional weight loss.  No changes with appetite.  Reports dark stools recently.  REVIEW OF SYSTEMS:   Review of Systems  All other systems reviewed and are negative.   As per HPI. Otherwise, a complete review of systems is negative.  PAST MEDICAL HISTORY: Past Medical History:  Diagnosis Date   Anemia    Anxiety     Arthritis    Clubbed foot    left   Depression    Duodenal ulcer 10/2016   Family history of adverse reaction to anesthesia    father combative/AMS after anethesia    Fracture of right humerus    hx of    GERD (gastroesophageal reflux disease)    Headache    hx of migraines    History of hiatal hernia    History of kidney stones    Shortness of breath dyspnea    due to anemia   UTI (urinary tract infection)     PAST SURGICAL HISTORY: Past Surgical History:  Procedure Laterality Date   ABDOMINAL HYSTERECTOMY  1980   COLONOSCOPY WITH PROPOFOL N/A 11/20/2016   Procedure: COLONOSCOPY WITH PROPOFOL;  Surgeon: Midge Minium, MD;  Location: [facility];  Service: Endoscopy;  Laterality: N/A;   CYSTOSCOPY W/ RETROGRADES Right 10/18/2016   Procedure: CYSTOSCOPY WITH RETROGRADE PYELOGRAM;  Surgeon: Hildred Laser, MD;  Location: [facility];  Service: Urology;  Laterality: Right;   CYSTOSCOPY W/ URETERAL STENT PLACEMENT Right 10/18/2016   Procedure: CYSTOSCOPY WITH STENT REPLACEMENT;  Surgeon: Hildred Laser, MD;  Location: [facility];  Service: Urology;  Laterality: Right;   CYSTOSCOPY WITH STENT PLACEMENT Right 10/03/2016   Procedure: CYSTOSCOPY WITH STENT PLACEMENT;  Surgeon: Malen Gauze, MD;  Location: [facility];  Service: Urology;  Laterality: Right;   ESOPHAGOGASTRODUODENOSCOPY N/A 10/05/2016   Procedure: ESOPHAGOGASTRODUODENOSCOPY (EGD);  Surgeon: Midge Minium, MD;  Location: [facility];  Service: Endoscopy;  Laterality: N/A;   EYE SURGERY  2023   POLYPECTOMY  11/20/2016   Procedure: POLYPECTOMY;  Surgeon: Midge Minium, MD;  Location: [facility];  Service: Endoscopy;;   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   Procedure: RIGHT TOTAL HIP ARTHROPLASTY ANTERIOR APPROACH;  Surgeon: Loanne Drilling, MD;  Location: [facility];  Service: Orthopedics;  Laterality: Right;   TOTAL HIP ARTHROPLASTY Left 11/28/2021   Procedure: TOTAL HIP ARTHROPLASTY ANTERIOR  APPROACH;  Surgeon: Ollen Gross, MD;  Location: [facility];  Service: Orthopedics;  Laterality: Left;   URETEROSCOPY Right 10/18/2016   Procedure: URETEROSCOPY;  Surgeon: Hildred Laser, MD;  Location: [facility];  Service: Urology;  Laterality: Right;    FAMILY HISTORY: Family History  Problem Relation Age of Onset   Hypertension Mother    CAD Neg Hx     HEALTH MAINTENANCE: Social History   Tobacco Use   Smoking status: Former    Current packs/day: 0.00    Types: Cigarettes    Quit date: 1972    Years since quitting: 52.7   Smokeless tobacco: Never   Tobacco comments:    smoked as teenager - 1 yr  Vaping Use   Vaping status: Never Used  Substance Use Topics   Alcohol use: Yes    Alcohol/week: 7.0 standard drinks of alcohol    Types: 7 Glasses of wine per week    Comment: wine daily   Drug use: No     Allergies  Allergen Reactions   Codeine Nausea Only    "clammy feeling"   Other Diarrhea    Red Meat    Current Outpatient Medications  Medication Sig Dispense Refill   acetaminophen (TYLENOL) 650 MG CR tablet Take 1,300 mg by mouth in the morning and at bedtime.     atorvastatin (LIPITOR) 10 MG tablet Take 10 mg by mouth in the morning.     diphenhydrAMINE (BENADRYL) 25 MG tablet Take 25 mg by mouth in the morning and at bedtime.     escitalopram (LEXAPRO) 20 MG tablet Take 10 mg by mouth every morning.     meclizine (ANTIVERT) 12.5 MG tablet Take 1 tablet (12.5 mg total) by mouth 3 (three) times daily as needed for up to 7 days for dizziness. 20 tablet 0   Multiple Vitamins-Minerals (ICAPS) TABS Take 1 tablet by mouth daily.     ondansetron (ZOFRAN-ODT) 4 MG disintegrating tablet Take 4 mg by mouth every 8 (eight) hours as needed.     fluticasone (FLONASE) 50 MCG/ACT nasal spray Place 2 sprays into both nostrils daily as needed (sinus issues.). (Patient not taking: Reported on 08/03/2022)     SUMAtriptan (IMITREX) 100 MG tablet Take 100 mg by mouth every 2 (two)  hours as needed for migraine. May repeat in 2 hours if headache persists or recurs. (Patient not taking: Reported on 11/02/2022)     No current facility-administered medications for this visit.    OBJECTIVE: Vitals:   02/15/23 1447  BP: (!) 160/98  Pulse: 87  Resp: 18  SpO2: 98%     Body mass index is 30.73 kg/m.      General: Well-developed, well-nourished, no acute distress. Eyes: Pink conjunctiva, anicteric sclera. HEENT: Normocephalic, moist mucous membranes, clear oropharnyx. Lungs: Clear to auscultation bilaterally. Heart: Regular rate and rhythm. No rubs, murmurs, or gallops. Abdomen: Soft, nontender, nondistended. No organomegaly noted, normoactive bowel sounds. Musculoskeletal: No edema, cyanosis, or clubbing. Neuro: Alert, answering all questions appropriately. Cranial nerves grossly intact.  Skin: No rashes or petechiae noted. Psych: Normal affect. Lymphatics: No cervical, calvicular, axillary or inguinal LAD.   LAB RESULTS:  Lab Results  Component Value Date   NA 137 02/15/2023   K 4.7 02/15/2023   CL 105 02/15/2023   CO2 24 02/15/2023   GLUCOSE 99 02/15/2023   BUN 19 02/15/2023   CREATININE 0.93 02/15/2023   CALCIUM 9.4 02/15/2023   PROT 7.4 02/15/2023   ALBUMIN 4.2 02/15/2023   AST 22 02/15/2023   ALT 17 02/15/2023   ALKPHOS 76 02/15/2023   BILITOT 0.5 02/15/2023   GFRNONAA >60 02/15/2023   GFRAA >60 10/04/2016    Lab Results  Component Value Date   WBC 6.0 02/15/2023   NEUTROABS 4.3 02/15/2023   HGB 13.9 02/15/2023   HCT 43.6 02/15/2023   MCV 101.6 (H) 02/15/2023   PLT 265 02/15/2023    Lab Results  Component Value Date   TIBC 375 11/02/2022   TIBC 448 10/03/2016   FERRITIN 39 11/02/2022   FERRITIN 12 10/03/2016   IRONPCTSAT 5 (L) 11/02/2022   IRONPCTSAT 1 (L) 10/03/2016     STUDIES: No results found.  ASSESSMENT AND PLAN:   Tiffany Mcclain is a 77 y.o. female with pmh of duodenal ulcer, GERD, anxiety, anemia was referred to  hematology for management of iron deficiency anemia.  # Iron deficiency anemia # History of duodenal ulcer and gastritis -Unable to tolerate oral iron due to GI upset -In 2018, was admitted for hemoglobin of 5.5 and treated with PRBC transfusions and IV iron infusions. EGD showed gastritis and 1 nonbleeding duodenal ulcer.  Colonoscopy showed multiple polyps with path tubular adenoma.    -Completed IV Venofer 200 mg x 5 doses on 09/18/2022.   - Hemoglobin is normal at 13.9.  Recent labs from 01/23/2023 with primary showed elevated TIBC with saturation of 9%.  Ferritin is not available.  She is not anemic but low saturation is consistent with iron deficiency.  Will consider a trial of IV Venofer 200 mg weekly x 5 doses and assess improvement in symptoms.  If her symptoms persist, will need further evaluation.  Wants to hold off on GI workup right now.  I am repeating iron levels again today.   # Macrocytosis without anemia -Vitamin B12 and folate pending -Monitor  Orders Placed This Encounter  Procedures   CBC with Differential (Cancer Center Only)   CMP (Cancer Center only)   Vitamin B12   Folate   Iron and TIBC([facility])   Ferritin   RTC in 8 weeks for MD visit, labs, possible Venofer.   Patient expressed understanding and was in agreement with this plan. She also understands that She can call clinic at any time with any questions, concerns, or complaints.   I spent a total of 25 minutes reviewing chart data, face-to-face evaluation with the patient, counseling and coordination of care as detailed above.  Michaelyn Barter, MD   02/15/2023 4:42 PM

## 2023-02-15 NOTE — Progress Notes (Signed)
Patient declined to wait the 30 minutes for post iron infusion observation today.  Post iron education done. Patient verbalized understanding. 

## 2023-02-19 ENCOUNTER — Telehealth: Payer: Self-pay

## 2023-02-19 NOTE — Telephone Encounter (Signed)
Patient got meds OTC

## 2023-02-22 ENCOUNTER — Inpatient Hospital Stay: Payer: Medicare HMO

## 2023-02-22 VITALS — BP 178/100 | HR 79 | Temp 97.2°F | Resp 18

## 2023-02-22 DIAGNOSIS — K269 Duodenal ulcer, unspecified as acute or chronic, without hemorrhage or perforation: Secondary | ICD-10-CM

## 2023-02-22 DIAGNOSIS — D500 Iron deficiency anemia secondary to blood loss (chronic): Secondary | ICD-10-CM

## 2023-02-22 DIAGNOSIS — D509 Iron deficiency anemia, unspecified: Secondary | ICD-10-CM | POA: Diagnosis not present

## 2023-02-22 MED ORDER — SODIUM CHLORIDE 0.9 % IV SOLN
INTRAVENOUS | Status: DC
  Filled 2023-02-22: qty 250

## 2023-02-22 MED ORDER — SODIUM CHLORIDE 0.9 % IV SOLN
200.0000 mg | Freq: Once | INTRAVENOUS | Status: AC
  Administered 2023-02-22: 200 mg via INTRAVENOUS
  Filled 2023-02-22: qty 200

## 2023-02-22 NOTE — Progress Notes (Signed)
Patient's BP was elevated post iron infusion today. She was asymptomatic, and per Dr. Cathie Hoops, ok to discharge to home. Patient will monitor her BP and report to her PMD.

## 2023-03-01 ENCOUNTER — Inpatient Hospital Stay: Payer: Medicare HMO

## 2023-03-01 VITALS — BP 163/94 | HR 62 | Temp 97.9°F | Resp 18

## 2023-03-01 DIAGNOSIS — D500 Iron deficiency anemia secondary to blood loss (chronic): Secondary | ICD-10-CM

## 2023-03-01 DIAGNOSIS — K269 Duodenal ulcer, unspecified as acute or chronic, without hemorrhage or perforation: Secondary | ICD-10-CM

## 2023-03-01 DIAGNOSIS — D509 Iron deficiency anemia, unspecified: Secondary | ICD-10-CM | POA: Diagnosis not present

## 2023-03-01 MED ORDER — SODIUM CHLORIDE 0.9 % IV SOLN
INTRAVENOUS | Status: DC
  Filled 2023-03-01 (×2): qty 250

## 2023-03-01 MED ORDER — SODIUM CHLORIDE 0.9 % IV SOLN
200.0000 mg | Freq: Once | INTRAVENOUS | Status: AC
  Administered 2023-03-01: 200 mg via INTRAVENOUS
  Filled 2023-03-01: qty 200

## 2023-03-01 NOTE — Patient Instructions (Signed)
Iron Sucrose Injection What is this medication? IRON SUCROSE (EYE ern SOO krose) treats low levels of iron (iron deficiency anemia) in people with kidney disease. Iron is a mineral that plays an important role in making red blood cells, which carry oxygen from your lungs to the rest of your body. This medicine may be used for other purposes; ask your health care provider or pharmacist if you have questions. COMMON BRAND NAME(S): Venofer What should I tell my care team before I take this medication? They need to know if you have any of these conditions: Anemia not caused by low iron levels Heart disease High levels of iron in the blood Kidney disease Liver disease An unusual or allergic reaction to iron, other medications, foods, dyes, or preservatives Pregnant or trying to get pregnant Breastfeeding How should I use this medication? This medication is for infusion into a vein. It is given in a hospital or clinic setting. Talk to your care team about the use of this medication in children. While this medication may be prescribed for children as young as 2 years for selected conditions, precautions do apply. Overdosage: If you think you have taken too much of this medicine contact a poison control center or emergency room at once. NOTE: This medicine is only for you. Do not share this medicine with others. What if I miss a dose? Keep appointments for follow-up doses. It is important not to miss your dose. Call your care team if you are unable to keep an appointment. What may interact with this medication? Do not take this medication with any of the following: Deferoxamine Dimercaprol Other iron products This medication may also interact with the following: Chloramphenicol Deferasirox This list may not describe all possible interactions. Give your health care provider a list of all the medicines, herbs, non-prescription drugs, or dietary supplements you use. Also tell them if you smoke,  drink alcohol, or use illegal drugs. Some items may interact with your medicine. What should I watch for while using this medication? Visit your care team regularly. Tell your care team if your symptoms do not start to get better or if they get worse. You may need blood work done while you are taking this medication. You may need to follow a special diet. Talk to your care team. Foods that contain iron include: whole grains/cereals, dried fruits, beans, or peas, leafy green vegetables, and organ meats (liver, kidney). What side effects may I notice from receiving this medication? Side effects that you should report to your care team as soon as possible: Allergic reactions--skin rash, itching, hives, swelling of the face, lips, tongue, or throat Low blood pressure--dizziness, feeling faint or lightheaded, blurry vision Shortness of breath Side effects that usually do not require medical attention (report to your care team if they continue or are bothersome): Flushing Headache Joint pain Muscle pain Nausea Pain, redness, or irritation at injection site This list may not describe all possible side effects. Call your doctor for medical advice about side effects. You may report side effects to FDA at 1-800-FDA-1088. Where should I keep my medication? This medication is given in a hospital or clinic. It will not be stored at home. NOTE: This sheet is a summary. It may not cover all possible information. If you have questions about this medicine, talk to your doctor, pharmacist, or health care provider.  2024 Elsevier/Gold Standard (2022-10-27 00:00:00)

## 2023-03-08 ENCOUNTER — Inpatient Hospital Stay: Payer: Medicare HMO | Attending: Internal Medicine

## 2023-03-08 VITALS — BP 162/105 | HR 82

## 2023-03-08 DIAGNOSIS — D500 Iron deficiency anemia secondary to blood loss (chronic): Secondary | ICD-10-CM

## 2023-03-08 DIAGNOSIS — K269 Duodenal ulcer, unspecified as acute or chronic, without hemorrhage or perforation: Secondary | ICD-10-CM

## 2023-03-08 DIAGNOSIS — D509 Iron deficiency anemia, unspecified: Secondary | ICD-10-CM | POA: Diagnosis present

## 2023-03-08 DIAGNOSIS — Z8719 Personal history of other diseases of the digestive system: Secondary | ICD-10-CM | POA: Diagnosis not present

## 2023-03-08 DIAGNOSIS — Z87891 Personal history of nicotine dependence: Secondary | ICD-10-CM | POA: Diagnosis not present

## 2023-03-08 DIAGNOSIS — Z8711 Personal history of peptic ulcer disease: Secondary | ICD-10-CM | POA: Insufficient documentation

## 2023-03-08 MED ORDER — SODIUM CHLORIDE 0.9 % IV SOLN
Freq: Once | INTRAVENOUS | Status: AC
  Filled 2023-03-08: qty 250

## 2023-03-08 MED ORDER — SODIUM CHLORIDE 0.9 % IV SOLN
200.0000 mg | Freq: Once | INTRAVENOUS | Status: AC
  Administered 2023-03-08: 200 mg via INTRAVENOUS
  Filled 2023-03-08: qty 200

## 2023-03-15 ENCOUNTER — Inpatient Hospital Stay: Payer: Medicare HMO

## 2023-03-15 VITALS — BP 163/93 | HR 65 | Temp 98.5°F | Resp 18

## 2023-03-15 DIAGNOSIS — D509 Iron deficiency anemia, unspecified: Secondary | ICD-10-CM | POA: Diagnosis not present

## 2023-03-15 DIAGNOSIS — K269 Duodenal ulcer, unspecified as acute or chronic, without hemorrhage or perforation: Secondary | ICD-10-CM

## 2023-03-15 DIAGNOSIS — D500 Iron deficiency anemia secondary to blood loss (chronic): Secondary | ICD-10-CM

## 2023-03-15 MED ORDER — SODIUM CHLORIDE 0.9 % IV SOLN
200.0000 mg | Freq: Once | INTRAVENOUS | Status: AC
  Administered 2023-03-15: 200 mg via INTRAVENOUS
  Filled 2023-03-15: qty 200

## 2023-03-15 MED ORDER — SODIUM CHLORIDE 0.9 % IV SOLN
INTRAVENOUS | Status: DC | PRN
  Filled 2023-03-15: qty 250

## 2023-03-15 NOTE — Addendum Note (Signed)
Addended by: Warren Lacy on: 03/15/2023 04:27 PM   Modules accepted: Orders

## 2023-04-05 ENCOUNTER — Encounter: Payer: Self-pay | Admitting: Internal Medicine

## 2023-04-05 ENCOUNTER — Inpatient Hospital Stay: Payer: Medicare HMO

## 2023-04-05 ENCOUNTER — Inpatient Hospital Stay: Payer: Medicare HMO | Admitting: Internal Medicine

## 2023-04-05 VITALS — BP 151/96 | HR 80 | Temp 99.4°F | Ht 64.0 in | Wt 180.2 lb

## 2023-04-05 DIAGNOSIS — D7589 Other specified diseases of blood and blood-forming organs: Secondary | ICD-10-CM | POA: Diagnosis not present

## 2023-04-05 DIAGNOSIS — D500 Iron deficiency anemia secondary to blood loss (chronic): Secondary | ICD-10-CM

## 2023-04-05 DIAGNOSIS — D509 Iron deficiency anemia, unspecified: Secondary | ICD-10-CM | POA: Diagnosis not present

## 2023-04-05 LAB — CBC WITH DIFFERENTIAL (CANCER CENTER ONLY)
Abs Immature Granulocytes: 0.02 10*3/uL (ref 0.00–0.07)
Basophils Absolute: 0 10*3/uL (ref 0.0–0.1)
Basophils Relative: 1 %
Eosinophils Absolute: 0.2 10*3/uL (ref 0.0–0.5)
Eosinophils Relative: 3 %
HCT: 46.8 % — ABNORMAL HIGH (ref 36.0–46.0)
Hemoglobin: 15.7 g/dL — ABNORMAL HIGH (ref 12.0–15.0)
Immature Granulocytes: 0 %
Lymphocytes Relative: 14 %
Lymphs Abs: 0.8 10*3/uL (ref 0.7–4.0)
MCH: 34.5 pg — ABNORMAL HIGH (ref 26.0–34.0)
MCHC: 33.5 g/dL (ref 30.0–36.0)
MCV: 102.9 fL — ABNORMAL HIGH (ref 80.0–100.0)
Monocytes Absolute: 0.4 10*3/uL (ref 0.1–1.0)
Monocytes Relative: 7 %
Neutro Abs: 4.5 10*3/uL (ref 1.7–7.7)
Neutrophils Relative %: 75 %
Platelet Count: 185 10*3/uL (ref 150–400)
RBC: 4.55 MIL/uL (ref 3.87–5.11)
RDW: 15.7 % — ABNORMAL HIGH (ref 11.5–15.5)
WBC Count: 5.9 10*3/uL (ref 4.0–10.5)
nRBC: 0 % (ref 0.0–0.2)

## 2023-04-05 LAB — CMP (CANCER CENTER ONLY)
ALT: 25 U/L (ref 0–44)
AST: 26 U/L (ref 15–41)
Albumin: 4.5 g/dL (ref 3.5–5.0)
Alkaline Phosphatase: 68 U/L (ref 38–126)
Anion gap: 10 (ref 5–15)
BUN: 16 mg/dL (ref 8–23)
CO2: 24 mmol/L (ref 22–32)
Calcium: 9.5 mg/dL (ref 8.9–10.3)
Chloride: 103 mmol/L (ref 98–111)
Creatinine: 1.03 mg/dL — ABNORMAL HIGH (ref 0.44–1.00)
GFR, Estimated: 56 mL/min — ABNORMAL LOW (ref >60)
Glucose, Bld: 107 mg/dL — ABNORMAL HIGH (ref 70–99)
Potassium: 4.3 mmol/L (ref 3.5–5.1)
Sodium: 137 mmol/L (ref 135–145)
Total Bilirubin: 1 mg/dL (ref 0.3–1.2)
Total Protein: 7.3 g/dL (ref 6.5–8.1)

## 2023-04-05 LAB — FOLATE: Folate: 9.1 ng/mL (ref >5.9)

## 2023-04-05 LAB — FERRITIN: Ferritin: 120 ng/mL (ref 11–307)

## 2023-04-05 NOTE — Progress Notes (Signed)
Pt c/o headache today. Having some nausea.  She states she had a migraine the next day after the infusions.

## 2023-04-05 NOTE — Progress Notes (Signed)
[facility]  Telephone:(336) [phone] Fax:(336) [phone]  ID: Tiffany Mcclain OB: September 28, 1945  MR#: [id number]  AOZ#:[id number]  Patient Care Team: Jaclyn Shaggy, MD as PCP - General (Internal Medicine) Michaelyn Barter, MD as Consulting Physician (Oncology)  REFERRING PROVIDER: Dr. Arlana Pouch  REASON FOR REFERRAL: Iron deficiency anemia  HPI: Tiffany Mcclain is a 77 y.o. female with past medical history of duodenal ulcer, GERD, anxiety, anemia was referred to hematology for management of iron deficiency anemia.  Patient had routine blood work with her primary.  Labs reviewed from 07/24/2022.  Iron 19, saturation 4%.  Ferritin not available.  Hemoglobin 9.4.  She has history of iron deficiency anemia back in 2018.  She was admitted in 2018 for hemoglobin of 5.5 and received 3 units of PRBC.  Patient had been taking NSAIDs for her neck and back pain.  EGD showed gastritis and 1 nonbleeding duodenal ulcer.  Colonoscopy showed multiple polyps with path tubular adenoma.  She received IV iron also.  She can get out of breath on exertion which has been progressive.  Denies NSAIDs use.  She has some melanotic stools on and off.  Completed IV Venofer 5 doses in April 2024.  02/15/2023-seen for worsening shortness of breath on exertion and dizziness.  Felt very similar to prior iron deficiency.  Labs from PCP.  Ferritin not available.  Iron saturation 9%.  Completed IV Venofer x 5 doses again in September 2024.  Interval history Patient seen today as follow-up for iron deficiency anemia, labs. Patient reports significantly better after completed iron infusions.  She can go up and down the stairs without shortness of breath.  Her dizziness is better.  Denies any dark stools.  Continues to decline GI eval.  REVIEW OF SYSTEMS:   Review of Systems  All other systems reviewed and are negative.   As per HPI. Otherwise, a complete review of systems is negative.  PAST MEDICAL  HISTORY: Past Medical History:  Diagnosis Date   Anemia    Anxiety    Arthritis    Clubbed foot    left   Depression    Duodenal ulcer 10/2016   Family history of adverse reaction to anesthesia    father combative/AMS after anethesia    Fracture of right humerus    hx of    GERD (gastroesophageal reflux disease)    Headache    hx of migraines    History of hiatal hernia    History of kidney stones    Shortness of breath dyspnea    due to anemia   UTI (urinary tract infection)     PAST SURGICAL HISTORY: Past Surgical History:  Procedure Laterality Date   ABDOMINAL HYSTERECTOMY  1980   COLONOSCOPY WITH PROPOFOL N/A 11/20/2016   Procedure: COLONOSCOPY WITH PROPOFOL;  Surgeon: Midge Minium, MD;  Location: [facility];  Service: Endoscopy;  Laterality: N/A;   CYSTOSCOPY W/ RETROGRADES Right 10/18/2016   Procedure: CYSTOSCOPY WITH RETROGRADE PYELOGRAM;  Surgeon: Hildred Laser, MD;  Location: [facility];  Service: Urology;  Laterality: Right;   CYSTOSCOPY W/ URETERAL STENT PLACEMENT Right 10/18/2016   Procedure: CYSTOSCOPY WITH STENT REPLACEMENT;  Surgeon: Hildred Laser, MD;  Location: [facility];  Service: Urology;  Laterality: Right;   CYSTOSCOPY WITH STENT PLACEMENT Right 10/03/2016   Procedure: CYSTOSCOPY WITH STENT PLACEMENT;  Surgeon: Malen Gauze, MD;  Location: [facility];  Service: Urology;  Laterality: Right;   ESOPHAGOGASTRODUODENOSCOPY N/A 10/05/2016   Procedure: ESOPHAGOGASTRODUODENOSCOPY (EGD);  Surgeon: Midge Minium, MD;  Location: [facility] END[facility];  Service: Endoscopy;  Laterality: N/A;   EYE SURGERY  2023   POLYPECTOMY  11/20/2016   Procedure: POLYPECTOMY;  Surgeon: Midge Minium, MD;  Location: [facility];  Service: Endoscopy;;   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   Procedure: RIGHT TOTAL HIP ARTHROPLASTY ANTERIOR APPROACH;  Surgeon: Loanne Drilling, MD;  Location: [facility];  Service: Orthopedics;  Laterality: Right;   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   Procedure: TOTAL HIP ARTHROPLASTY ANTERIOR APPROACH;  Surgeon: Ollen Gross, MD;  Location: [facility];  Service: Orthopedics;  Laterality: Left;   URETEROSCOPY Right 10/18/2016   Procedure: URETEROSCOPY;  Surgeon: Hildred Laser, MD;  Location: [facility];  Service: Urology;  Laterality: Right;    FAMILY HISTORY: Family History  Problem Relation Age of Onset   Hypertension Mother    CAD Neg Hx     HEALTH MAINTENANCE: Social History   Tobacco Use   Smoking status: Former    Current packs/day: 0.00    Types: Cigarettes    Quit date: 1972    Years since quitting: 52.8   Smokeless tobacco: Never   Tobacco comments:    smoked as teenager - 1 yr  Vaping Use   Vaping status: Never Used  Substance Use Topics   Alcohol use: Yes    Alcohol/week: 7.0 standard drinks of alcohol    Types: 7 Glasses of wine per week    Comment: wine daily   Drug use: No     Allergies  Allergen Reactions   Codeine Nausea Only    "clammy feeling"   Other Diarrhea    Red Meat    Current Outpatient Medications  Medication Sig Dispense Refill   acetaminophen (TYLENOL) 650 MG CR tablet Take 1,300 mg by mouth in the morning and at bedtime.     atorvastatin (LIPITOR) 10 MG tablet Take 10 mg by mouth in the morning.     diphenhydrAMINE (BENADRYL) 25 MG tablet Take 25 mg by mouth in the morning and at bedtime.     escitalopram (LEXAPRO) 20 MG tablet Take 10 mg by mouth every morning.     Multiple Vitamins-Minerals (ICAPS) TABS Take 1 tablet by mouth daily.     ondansetron (ZOFRAN-ODT) 4 MG disintegrating tablet Take 4 mg by mouth every 8 (eight) hours as needed.     SUMAtriptan (IMITREX) 100 MG tablet Take 100 mg by mouth every 2 (two) hours as needed for migraine. May repeat in 2 hours if headache persists or recurs.     fluticasone (FLONASE) 50 MCG/ACT nasal spray Place 2 sprays into both nostrils daily as needed (sinus issues.). (Patient not taking: Reported on  08/03/2022)     No current facility-administered medications for this visit.    OBJECTIVE: Vitals:   04/05/23 1318 04/05/23 1336  BP: (!) 173/96 (!) 151/96  Pulse: 80   Temp: 99.4 F (37.4 C)   SpO2: 98%       Body mass index is 30.93 kg/m.      General: Well-developed, well-nourished, no acute distress. Eyes: Pink conjunctiva, anicteric sclera. HEENT: Normocephalic, moist mucous membranes, clear oropharnyx. Lungs: Clear to auscultation bilaterally. Heart: Regular rate and rhythm. No rubs, murmurs, or gallops. Abdomen: Soft, nontender, nondistended. No organomegaly noted, normoactive bowel sounds. Musculoskeletal: No edema, cyanosis, or clubbing. Neuro: Alert, answering all questions appropriately. Cranial nerves grossly intact. Skin: No rashes or petechiae noted. Psych: Normal affect. Lymphatics: No cervical, calvicular, axillary or inguinal LAD.  LAB RESULTS:  Lab Results  Component Value Date   NA 137 04/05/2023   K 4.3 04/05/2023   CL 103 04/05/2023   CO2 24 04/05/2023   GLUCOSE 107 (H) 04/05/2023   BUN 16 04/05/2023   CREATININE 1.03 (H) 04/05/2023   CALCIUM 9.5 04/05/2023   PROT 7.3 04/05/2023   ALBUMIN 4.5 04/05/2023   AST 26 04/05/2023   ALT 25 04/05/2023   ALKPHOS 68 04/05/2023   BILITOT 1.0 04/05/2023   GFRNONAA 56 (L) 04/05/2023   GFRAA >60 10/04/2016    Lab Results  Component Value Date   WBC 5.9 04/05/2023   NEUTROABS 4.5 04/05/2023   HGB 15.7 (H) 04/05/2023   HCT 46.8 (H) 04/05/2023   MCV 102.9 (H) 04/05/2023   PLT 185 04/05/2023    Lab Results  Component Value Date   TIBC 483 (H) 02/15/2023   TIBC 375 11/02/2022   TIBC 448 10/03/2016   FERRITIN 11 02/15/2023   FERRITIN 39 11/02/2022   FERRITIN 12 10/03/2016   IRONPCTSAT 15 02/15/2023   IRONPCTSAT 5 (L) 11/02/2022   IRONPCTSAT 1 (L) 10/03/2016     STUDIES: No results found.  ASSESSMENT AND PLAN:   Bert K Ladd is a 77 y.o. female with pmh of duodenal ulcer, GERD,  anxiety, anemia was referred to hematology for management of iron deficiency anemia.  # Iron deficiency anemia # History of duodenal ulcer and gastritis -Unable to tolerate oral iron due to GI upset -In 2018, was admitted for hemoglobin of 5.5 and treated with PRBC transfusions and IV iron infusions. EGD showed gastritis and 1 nonbleeding duodenal ulcer.  Colonoscopy showed multiple polyps with path tubular adenoma.    -Completed IV Venofer 200 mg x 5 doses on 09/18/2022.   - 02/15/2023-seen for worsening shortness of breath on exertion and dizziness.  Felt very similar to prior iron deficiency.  Labs from PCP.  Ferritin not available.  Iron saturation 9%.  Hemoglobin 13.6 completed IV Venofer x 5 doses again in September 2024.  Hemoglobin today is 15.7.  Iron panel is pending.  Hold off on IV iron today.  Reports significant improvement in her symptoms.  In 3 months labs only.  I will follow-up with her in 6 months.  Continues to decline GI eval.  # Macrocytosis without anemia -B12 558, folate normal. -LFTs normal.  Continue to monitor.  Orders Placed This Encounter  Procedures   CBC with Differential/Platelet   Iron and TIBC   Ferritin   CBC with Differential/Platelet   Iron and TIBC   Ferritin   In 3 months labs only In 6 months MD visit, labs, possible Venofer   Patient expressed understanding and was in agreement with this plan. She also understands that She can call clinic at any time with any questions, concerns, or complaints.   I spent a total of 25 minutes reviewing chart data, face-to-face evaluation with the patient, counseling and coordination of care as detailed above.  Michaelyn Barter, MD   04/05/2023 2:03 PM

## 2023-04-06 LAB — VITAMIN B12: Vitamin B-12: 784 pg/mL (ref 180–914)

## 2023-06-22 ENCOUNTER — Encounter: Payer: Self-pay | Admitting: Internal Medicine

## 2023-07-06 ENCOUNTER — Encounter: Payer: Self-pay | Admitting: Internal Medicine

## 2023-07-06 ENCOUNTER — Inpatient Hospital Stay: Payer: Medicare HMO | Attending: Internal Medicine

## 2023-07-06 DIAGNOSIS — D509 Iron deficiency anemia, unspecified: Secondary | ICD-10-CM | POA: Diagnosis present

## 2023-07-06 DIAGNOSIS — D500 Iron deficiency anemia secondary to blood loss (chronic): Secondary | ICD-10-CM

## 2023-07-06 LAB — CBC WITH DIFFERENTIAL/PLATELET
Abs Immature Granulocytes: 0.03 10*3/uL (ref 0.00–0.07)
Basophils Absolute: 0 10*3/uL (ref 0.0–0.1)
Basophils Relative: 1 %
Eosinophils Absolute: 0.2 10*3/uL (ref 0.0–0.5)
Eosinophils Relative: 3 %
HCT: 48.2 % — ABNORMAL HIGH (ref 36.0–46.0)
Hemoglobin: 16.2 g/dL — ABNORMAL HIGH (ref 12.0–15.0)
Immature Granulocytes: 0 %
Lymphocytes Relative: 15 %
Lymphs Abs: 1 10*3/uL (ref 0.7–4.0)
MCH: 36.3 pg — ABNORMAL HIGH (ref 26.0–34.0)
MCHC: 33.6 g/dL (ref 30.0–36.0)
MCV: 108.1 fL — ABNORMAL HIGH (ref 80.0–100.0)
Monocytes Absolute: 0.5 10*3/uL (ref 0.1–1.0)
Monocytes Relative: 7 %
Neutro Abs: 5.1 10*3/uL (ref 1.7–7.7)
Neutrophils Relative %: 74 %
Platelets: 221 10*3/uL (ref 150–400)
RBC: 4.46 MIL/uL (ref 3.87–5.11)
RDW: 13.1 % (ref 11.5–15.5)
WBC: 6.8 10*3/uL (ref 4.0–10.5)
nRBC: 0 % (ref 0.0–0.2)

## 2023-07-06 LAB — FERRITIN: Ferritin: 34 ng/mL (ref 11–307)

## 2023-07-06 LAB — IRON AND TIBC
Iron: 120 ug/dL (ref 28–170)
Saturation Ratios: 25 % (ref 10.4–31.8)
TIBC: 472 ug/dL — ABNORMAL HIGH (ref 250–450)
UIBC: 352 ug/dL

## 2023-07-10 ENCOUNTER — Encounter: Payer: Self-pay | Admitting: Internal Medicine

## 2023-07-11 ENCOUNTER — Encounter: Payer: Self-pay | Admitting: Internal Medicine

## 2023-10-01 ENCOUNTER — Other Ambulatory Visit: Payer: Self-pay | Admitting: *Deleted

## 2023-10-01 DIAGNOSIS — D500 Iron deficiency anemia secondary to blood loss (chronic): Secondary | ICD-10-CM

## 2023-10-05 ENCOUNTER — Inpatient Hospital Stay (HOSPITAL_BASED_OUTPATIENT_CLINIC_OR_DEPARTMENT_OTHER): Payer: Medicare HMO | Admitting: Internal Medicine

## 2023-10-05 ENCOUNTER — Inpatient Hospital Stay: Payer: Medicare HMO | Attending: Internal Medicine

## 2023-10-05 ENCOUNTER — Encounter: Payer: Self-pay | Admitting: Internal Medicine

## 2023-10-05 ENCOUNTER — Inpatient Hospital Stay: Payer: Medicare HMO

## 2023-10-05 VITALS — BP 140/82 | HR 80 | Temp 99.4°F | Resp 18 | Wt 182.0 lb

## 2023-10-05 DIAGNOSIS — D7589 Other specified diseases of blood and blood-forming organs: Secondary | ICD-10-CM | POA: Insufficient documentation

## 2023-10-05 DIAGNOSIS — Z87891 Personal history of nicotine dependence: Secondary | ICD-10-CM | POA: Insufficient documentation

## 2023-10-05 DIAGNOSIS — D509 Iron deficiency anemia, unspecified: Secondary | ICD-10-CM | POA: Insufficient documentation

## 2023-10-05 DIAGNOSIS — Z8719 Personal history of other diseases of the digestive system: Secondary | ICD-10-CM | POA: Insufficient documentation

## 2023-10-05 DIAGNOSIS — D500 Iron deficiency anemia secondary to blood loss (chronic): Secondary | ICD-10-CM

## 2023-10-05 LAB — CBC WITH DIFFERENTIAL (CANCER CENTER ONLY)
Abs Immature Granulocytes: 0.01 10*3/uL (ref 0.00–0.07)
Basophils Absolute: 0 10*3/uL (ref 0.0–0.1)
Basophils Relative: 1 %
Eosinophils Absolute: 0.3 10*3/uL (ref 0.0–0.5)
Eosinophils Relative: 6 %
HCT: 42.9 % (ref 36.0–46.0)
Hemoglobin: 14.3 g/dL (ref 12.0–15.0)
Immature Granulocytes: 0 %
Lymphocytes Relative: 17 %
Lymphs Abs: 0.9 10*3/uL (ref 0.7–4.0)
MCH: 35.2 pg — ABNORMAL HIGH (ref 26.0–34.0)
MCHC: 33.3 g/dL (ref 30.0–36.0)
MCV: 105.7 fL — ABNORMAL HIGH (ref 80.0–100.0)
Monocytes Absolute: 0.6 10*3/uL (ref 0.1–1.0)
Monocytes Relative: 12 %
Neutro Abs: 3.3 10*3/uL (ref 1.7–7.7)
Neutrophils Relative %: 64 %
Platelet Count: 202 10*3/uL (ref 150–400)
RBC: 4.06 MIL/uL (ref 3.87–5.11)
RDW: 12.7 % (ref 11.5–15.5)
WBC Count: 5.1 10*3/uL (ref 4.0–10.5)
nRBC: 0 % (ref 0.0–0.2)

## 2023-10-05 LAB — IRON AND TIBC
Iron: 91 ug/dL (ref 28–170)
Saturation Ratios: 20 % (ref 10.4–31.8)
TIBC: 459 ug/dL — ABNORMAL HIGH (ref 250–450)
UIBC: 368 ug/dL

## 2023-10-05 LAB — FERRITIN: Ferritin: 21 ng/mL (ref 11–307)

## 2023-10-05 NOTE — Progress Notes (Signed)
 [facility]  Telephone:([phone] Fax:(336) [phone]  ID: Tiffany Mcclain OB: 1945/08/15  MR#: [id number]  ONG#:[id number]  Patient Care Team: Westley Hammers, MD as PCP - General (Internal Medicine) Loreatha Rodney, MD as Consulting Physician (Oncology)  REFERRING PROVIDER: Dr. Arabella Knife  REASON FOR REFERRAL: Iron  deficiency anemia  HPI: Tiffany Mcclain is a 78 y.o. female with past medical history of duodenal ulcer, GERD, anxiety, anemia was referred to hematology for management of iron  deficiency anemia.  Patient had routine blood work with her primary.  Labs reviewed from 07/24/2022.  Iron  19, saturation 4%.  Ferritin not available.  Hemoglobin 9.4.  She has history of iron  deficiency anemia back in 2018.  She was admitted in 2018 for hemoglobin of 5.5 and received 3 units of PRBC.  Patient had been taking NSAIDs for her neck and back pain.  EGD showed gastritis and 1 nonbleeding duodenal ulcer.  Colonoscopy showed multiple polyps with path tubular adenoma.    Completed IV Venofer  5 doses in April 2024.  02/15/2023-seen for worsening shortness of breath on exertion and dizziness.  Felt very similar to prior iron  deficiency.  Labs from PCP.  Ferritin not available.  Iron  saturation 9%.  Completed IV Venofer  x 5 doses again in September 2024.  Interval history Patient seen today as follow-up for iron  deficiency anemia, labs. She is doing well overall.  Reports some shortness of breath on exertion.  Denies any dizziness.  Does report episode of black stool maybe once a month.  Denies any fresh blood.   REVIEW OF SYSTEMS:   Review of Systems  All other systems reviewed and are negative.   As per HPI. Otherwise, a complete review of systems is negative.  PAST MEDICAL HISTORY: Past Medical History:  Diagnosis Date   Anemia    Anxiety    Arthritis    Clubbed foot    left   Depression    Duodenal ulcer 10/2016   Family history of adverse reaction to  anesthesia    father combative/AMS after anethesia    Fracture of right humerus    hx of    GERD (gastroesophageal reflux disease)    Headache    hx of migraines    History of hiatal hernia    History of kidney stones    Shortness of breath dyspnea    due to anemia   UTI (urinary tract infection)     PAST SURGICAL HISTORY: Past Surgical History:  Procedure Laterality Date   ABDOMINAL HYSTERECTOMY  1980   COLONOSCOPY WITH PROPOFOL  N/A 11/20/2016   Procedure: COLONOSCOPY WITH PROPOFOL ;  Surgeon: Marnee Sink, MD;  Location: [facility];  Service: Endoscopy;  Laterality: N/A;   CYSTOSCOPY W/ RETROGRADES Right 10/18/2016   Procedure: CYSTOSCOPY WITH RETROGRADE PYELOGRAM;  Surgeon: Bart Born, MD;  Location: [facility];  Service: Urology;  Laterality: Right;   CYSTOSCOPY W/ URETERAL STENT PLACEMENT Right 10/18/2016   Procedure: CYSTOSCOPY WITH STENT REPLACEMENT;  Surgeon: Bart Born, MD;  Location: [facility];  Service: Urology;  Laterality: Right;   CYSTOSCOPY WITH STENT PLACEMENT Right 10/03/2016   Procedure: CYSTOSCOPY WITH STENT PLACEMENT;  Surgeon: Marco Severs, MD;  Location: [facility];  Service: Urology;  Laterality: Right;   ESOPHAGOGASTRODUODENOSCOPY N/A 10/05/2016   Procedure: ESOPHAGOGASTRODUODENOSCOPY (EGD);  Surgeon: Marnee Sink, MD;  Location: [facility] END[facility];  Service: Endoscopy;  Laterality: N/A;   EYE SURGERY  2023   POLYPECTOMY  11/20/2016   Procedure: POLYPECTOMY;  Surgeon: Ole Berkeley,  Darren, MD;  Location: [facility];  Service: Endoscopy;;   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   Procedure: RIGHT TOTAL HIP ARTHROPLASTY ANTERIOR APPROACH;  Surgeon: Aurther Blue, MD;  Location: [facility];  Service: Orthopedics;  Laterality: Right;   TOTAL HIP ARTHROPLASTY Left 11/28/2021   Procedure: TOTAL HIP ARTHROPLASTY ANTERIOR APPROACH;  Surgeon: Liliane Rei, MD;  Location: [facility];  Service: Orthopedics;  Laterality: Left;   URETEROSCOPY Right  10/18/2016   Procedure: URETEROSCOPY;  Surgeon: Bart Born, MD;  Location: [facility];  Service: Urology;  Laterality: Right;    FAMILY HISTORY: Family History  Problem Relation Age of Onset   Hypertension Mother    CAD Neg Hx     HEALTH MAINTENANCE: Social History   Tobacco Use   Smoking status: Former    Current packs/day: 0.00    Types: Cigarettes    Quit date: 1972    Years since quitting: 53.3   Smokeless tobacco: Never   Tobacco comments:    smoked as teenager - 1 yr  Vaping Use   Vaping status: Never Used  Substance Use Topics   Alcohol use: Yes    Alcohol/week: 7.0 standard drinks of alcohol    Types: 7 Glasses of wine per week    Comment: wine daily   Drug use: No     Allergies  Allergen Reactions   Codeine Nausea Only    "clammy feeling"   Other Diarrhea    Red Meat    Current Outpatient Medications  Medication Sig Dispense Refill   acetaminophen  (TYLENOL ) 650 MG CR tablet Take 1,300 mg by mouth in the morning and at bedtime.     atorvastatin  (LIPITOR) 10 MG tablet Take 10 mg by mouth in the morning.     diphenhydrAMINE  (BENADRYL ) 25 MG tablet Take 25 mg by mouth in the morning and at bedtime.     escitalopram  (LEXAPRO ) 20 MG tablet Take 10 mg by mouth every morning.     fluticasone (FLONASE) 50 MCG/ACT nasal spray Place 2 sprays into both nostrils daily as needed (sinus issues.).     ondansetron  (ZOFRAN -ODT) 4 MG disintegrating tablet Take 4 mg by mouth every 8 (eight) hours as needed.     SUMAtriptan (IMITREX) 100 MG tablet Take 100 mg by mouth every 2 (two) hours as needed for migraine. May repeat in 2 hours if headache persists or recurs.     No current facility-administered medications for this visit.    OBJECTIVE: Vitals:   10/05/23 1409  BP: (!) 140/82  Pulse: 80  Resp: 18  Temp: 99.4 F (37.4 C)  SpO2: 96%       Body mass index is 31.24 kg/m.      General: Well-developed, well-nourished, no acute distress. Eyes: Pink  conjunctiva, anicteric sclera. HEENT: Normocephalic, moist mucous membranes, clear oropharnyx. Lungs: Clear to auscultation bilaterally. Heart: Regular rate and rhythm. No rubs, murmurs, or gallops. Abdomen: Soft, nontender, nondistended. No organomegaly noted, normoactive bowel sounds. Musculoskeletal: No edema, cyanosis, or clubbing. Neuro: Alert, answering all questions appropriately. Cranial nerves grossly intact. Skin: No rashes or petechiae noted. Psych: Normal affect. Lymphatics: No cervical, calvicular, axillary or inguinal LAD.   LAB RESULTS:  Lab Results  Component Value Date   NA 137 04/05/2023   K 4.3 04/05/2023   CL 103 04/05/2023   CO2 24 04/05/2023   GLUCOSE 107 (H) 04/05/2023   BUN 16 04/05/2023   CREATININE 1.03 (H) 04/05/2023   CALCIUM  9.5 04/05/2023  PROT 7.3 04/05/2023   ALBUMIN 4.5 04/05/2023   AST 26 04/05/2023   ALT 25 04/05/2023   ALKPHOS 68 04/05/2023   BILITOT 1.0 04/05/2023   GFRNONAA 56 (L) 04/05/2023   GFRAA >60 10/04/2016    Lab Results  Component Value Date   WBC 5.1 10/05/2023   NEUTROABS 3.3 10/05/2023   HGB 14.3 10/05/2023   HCT 42.9 10/05/2023   MCV 105.7 (H) 10/05/2023   PLT 202 10/05/2023    Lab Results  Component Value Date   TIBC 472 (H) 07/06/2023   TIBC 483 (H) 02/15/2023   TIBC 375 11/02/2022   FERRITIN 34 07/06/2023   FERRITIN 120 04/05/2023   FERRITIN 11 02/15/2023   IRONPCTSAT 25 07/06/2023   IRONPCTSAT 15 02/15/2023   IRONPCTSAT 5 (L) 11/02/2022     STUDIES: No results found.  ASSESSMENT AND PLAN:   Karol K Mumme is a 78 y.o. female with pmh of duodenal ulcer, GERD, anxiety, anemia was referred to hematology for management of iron  deficiency anemia.  # Iron  deficiency anemia # History of duodenal ulcer and gastritis - Has history of duodenal ulcer with nonbleeding.  Could be component of malabsorption.  Unable to tolerate oral iron  due to GI upset  - Last treated with IV Venofer  in October 2024.   Hemoglobin today normal at 14.3.  Iron  studies are pending.  Hold off on IV Venofer .  Previously discussed about repeat GI workup with recurrent iron  deficiency.  Patient has decided to hold off.  # Macrocytosis without anemia - Vitamin B12/folate normal.  LFTs normal. - With persistent macrocytosis, discussed about ultrasound of the liver which she would like to proceed with. - She does not have any cytopenias which is reassuring.  - If ultrasound liver comes back okay, continue with monitoring  Orders Placed This Encounter  Procedures   US  Abdomen Limited   CBC with Differential (Cancer Center Only)   Iron  and TIBC([facility])   Ferritin   Vitamin B12   Schedule follow-up in 3 months for MD visit labs, possible venofer , discuss US .    Patient expressed understanding and was in agreement with this plan. She also understands that She can call clinic at any time with any questions, concerns, or complaints.   I spent a total of 25 minutes reviewing chart data, face-to-face evaluation with the patient, counseling and coordination of care as detailed above.  Loreatha Rodney, MD   10/05/2023 2:34 PM

## 2023-10-05 NOTE — Progress Notes (Signed)
 Patient is doing good no new questions or concerns for the doctor today.

## 2023-12-05 ENCOUNTER — Ambulatory Visit: Admission: RE | Admit: 2023-12-05 | Source: Ambulatory Visit

## 2024-01-04 ENCOUNTER — Encounter: Payer: Self-pay | Admitting: Oncology

## 2024-01-04 ENCOUNTER — Inpatient Hospital Stay

## 2024-01-04 ENCOUNTER — Inpatient Hospital Stay: Attending: Internal Medicine

## 2024-01-04 ENCOUNTER — Inpatient Hospital Stay: Admitting: Oncology

## 2024-01-04 VITALS — BP 132/84 | HR 91 | Temp 98.7°F | Resp 19 | Wt 185.1 lb

## 2024-01-04 DIAGNOSIS — D509 Iron deficiency anemia, unspecified: Secondary | ICD-10-CM | POA: Insufficient documentation

## 2024-01-04 DIAGNOSIS — Z87891 Personal history of nicotine dependence: Secondary | ICD-10-CM | POA: Diagnosis not present

## 2024-01-04 DIAGNOSIS — D7589 Other specified diseases of blood and blood-forming organs: Secondary | ICD-10-CM

## 2024-01-04 LAB — CBC WITH DIFFERENTIAL (CANCER CENTER ONLY)
Abs Immature Granulocytes: 0.02 K/uL (ref 0.00–0.07)
Basophils Absolute: 0 K/uL (ref 0.0–0.1)
Basophils Relative: 1 %
Eosinophils Absolute: 0.2 K/uL (ref 0.0–0.5)
Eosinophils Relative: 5 %
HCT: 45.5 % (ref 36.0–46.0)
Hemoglobin: 15.4 g/dL — ABNORMAL HIGH (ref 12.0–15.0)
Immature Granulocytes: 0 %
Lymphocytes Relative: 19 %
Lymphs Abs: 1 K/uL (ref 0.7–4.0)
MCH: 35 pg — ABNORMAL HIGH (ref 26.0–34.0)
MCHC: 33.8 g/dL (ref 30.0–36.0)
MCV: 103.4 fL — ABNORMAL HIGH (ref 80.0–100.0)
Monocytes Absolute: 0.6 K/uL (ref 0.1–1.0)
Monocytes Relative: 11 %
Neutro Abs: 3.2 K/uL (ref 1.7–7.7)
Neutrophils Relative %: 64 %
Platelet Count: 193 K/uL (ref 150–400)
RBC: 4.4 MIL/uL (ref 3.87–5.11)
RDW: 13.3 % (ref 11.5–15.5)
WBC Count: 5 K/uL (ref 4.0–10.5)
nRBC: 0 % (ref 0.0–0.2)

## 2024-01-04 LAB — FERRITIN: Ferritin: 17 ng/mL (ref 11–307)

## 2024-01-04 LAB — VITAMIN B12: Vitamin B-12: 576 pg/mL (ref 180–914)

## 2024-01-04 LAB — IRON AND TIBC
Iron: 131 ug/dL (ref 28–170)
Saturation Ratios: 30 % (ref 10.4–31.8)
TIBC: 438 ug/dL (ref 250–450)
UIBC: 307 ug/dL

## 2024-01-04 NOTE — Progress Notes (Signed)
 [facility]  [phone] [phone]  ID: Tiffany Mcclain OB: May 19, 1946  MR#: [id number]  RDW#:[id number]  Patient Care Team: Corlis Honor BROCKS, MD as PCP - General (Internal Medicine) Jacobo Evalene PARAS, MD as Consulting Physician (Oncology)  CHIEF COMPLAINT: Iron  deficiency anemia.  INTERVAL HISTORY: Patient returns to clinic today for repeat laboratory work, further evaluation, and consideration of additional IV Venofer .  She currently feels well and is asymptomatic.  She does not complain of any weakness or fatigue.  She has good appetite and denies weight loss.  She has no neurologic complaints.  She denies any recent fevers or illnesses.  She has no chest pain, shortness of breath, cough, or hemoptysis.  She denies any nausea, vomiting, constipation, or diarrhea.  She has no urinary complaints.  Patient feels at her baseline and offers no specific complaints today.  REVIEW OF SYSTEMS:   Review of Systems  Constitutional: Negative.  Negative for fever, malaise/fatigue and weight loss.  Respiratory: Negative.  Negative for cough, hemoptysis and shortness of breath.   Cardiovascular: Negative.  Negative for chest pain and leg swelling.  Gastrointestinal: Negative.  Negative for abdominal pain and blood in stool.  Genitourinary: Negative.  Negative for dysuria.  Musculoskeletal: Negative.  Negative for back pain.  Skin: Negative.  Negative for rash.  Neurological: Negative.  Negative for dizziness, focal weakness, weakness and headaches.  Psychiatric/Behavioral: Negative.  The patient is not nervous/anxious.     As per HPI. Otherwise, a complete review of systems is negative.  PAST MEDICAL HISTORY: Past Medical History:  Diagnosis Date   Anemia    Anxiety    Arthritis    Clubbed foot    left   Depression    Duodenal ulcer 10/2016   Family history of adverse reaction to anesthesia    father combative/AMS after anethesia    Fracture of  right humerus    hx of    GERD (gastroesophageal reflux disease)    Headache    hx of migraines    History of hiatal hernia    History of kidney stones    Shortness of breath dyspnea    due to anemia   UTI (urinary tract infection)     PAST SURGICAL HISTORY: Past Surgical History:  Procedure Laterality Date   ABDOMINAL HYSTERECTOMY  1980   COLONOSCOPY WITH PROPOFOL  N/A 11/20/2016   Procedure: COLONOSCOPY WITH PROPOFOL ;  Surgeon: Jinny Carmine, MD;  Location: [facility];  Service: Endoscopy;  Laterality: N/A;   CYSTOSCOPY W/ RETROGRADES Right 10/18/2016   Procedure: CYSTOSCOPY WITH RETROGRADE PYELOGRAM;  Surgeon: Chauncey Redell Agent, MD;  Location: [facility];  Service: Urology;  Laterality: Right;   CYSTOSCOPY W/ URETERAL STENT PLACEMENT Right 10/18/2016   Procedure: CYSTOSCOPY WITH STENT REPLACEMENT;  Surgeon: Chauncey Redell Agent, MD;  Location: [facility];  Service: Urology;  Laterality: Right;   CYSTOSCOPY WITH STENT PLACEMENT Right 10/03/2016   Procedure: CYSTOSCOPY WITH STENT PLACEMENT;  Surgeon: Belvie LITTIE Clara, MD;  Location: [facility];  Service: Urology;  Laterality: Right;   ESOPHAGOGASTRODUODENOSCOPY N/A 10/05/2016   Procedure: ESOPHAGOGASTRODUODENOSCOPY (EGD);  Surgeon: Carmine Jinny, MD;  Location: [facility] ENDOSCOPY;  Service: Endoscopy;  Laterality: N/A;   EYE SURGERY  2023   POLYPECTOMY  11/20/2016   Procedure: POLYPECTOMY;  Surgeon: Jinny Carmine, MD;  Location: [facility];  Service: Endoscopy;;   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   Procedure: RIGHT TOTAL HIP ARTHROPLASTY ANTERIOR APPROACH;  Surgeon: Dempsey Melodi GAILS, MD;  Location:  [facility];  Service: Orthopedics;  Laterality: Right;   TOTAL HIP ARTHROPLASTY Left 11/28/2021   Procedure: TOTAL HIP ARTHROPLASTY ANTERIOR APPROACH;  Surgeon: Melodi Lerner, MD;  Location: [facility];  Service: Orthopedics;  Laterality: Left;   URETEROSCOPY Right 10/18/2016   Procedure: URETEROSCOPY;  Surgeon: Chauncey Redell Agent,  MD;  Location: [facility];  Service: Urology;  Laterality: Right;    FAMILY HISTORY: Family History  Problem Relation Age of Onset   Hypertension Mother    CAD Neg Hx     ADVANCED DIRECTIVES (Y/N):  N  HEALTH MAINTENANCE: Social History   Tobacco Use   Smoking status: Former    Current packs/day: 0.00    Types: Cigarettes    Quit date: 1972    Years since quitting: 53.6   Smokeless tobacco: Never   Tobacco comments:    smoked as teenager - 1 yr  Vaping Use   Vaping status: Never Used  Substance Use Topics   Alcohol use: Yes    Alcohol/week: 7.0 standard drinks of alcohol    Types: 7 Glasses of wine per week    Comment: wine daily   Drug use: No     Colonoscopy:  PAP:  Bone density:  Lipid panel:  Allergies  Allergen Reactions   Codeine Nausea Only    clammy feeling   Other Diarrhea    Red Meat    Current Outpatient Medications  Medication Sig Dispense Refill   acetaminophen  (TYLENOL ) 650 MG CR tablet Take 1,300 mg by mouth in the morning and at bedtime.     atorvastatin  (LIPITOR) 10 MG tablet Take 10 mg by mouth in the morning.     diphenhydrAMINE  (BENADRYL ) 25 MG tablet Take 25 mg by mouth in the morning and at bedtime.     escitalopram  (LEXAPRO ) 20 MG tablet Take 10 mg by mouth every morning.     fluticasone (FLONASE) 50 MCG/ACT nasal spray Place 2 sprays into both nostrils daily as needed (sinus issues.).     ondansetron  (ZOFRAN -ODT) 4 MG disintegrating tablet Take 4 mg by mouth every 8 (eight) hours as needed.     SUMAtriptan (IMITREX) 100 MG tablet Take 100 mg by mouth every 2 (two) hours as needed for migraine. May repeat in 2 hours if headache persists or recurs.     No current facility-administered medications for this visit.    OBJECTIVE: Vitals:   01/04/24 1101  BP: 132/84  Pulse: 91  Resp: 19  Temp: 98.7 F (37.1 C)  SpO2: 98%     Body mass index is 31.77 kg/m.    ECOG FS:0 - Asymptomatic  General: Well-developed, well-nourished, no  acute distress. Eyes: Pink conjunctiva, anicteric sclera. HEENT: Normocephalic, moist mucous membranes. Lungs: No audible wheezing or coughing. Heart: Regular rate and rhythm. Abdomen: Soft, nontender, no obvious distention. Musculoskeletal: No edema, cyanosis, or clubbing. Neuro: Alert, answering all questions appropriately. Cranial nerves grossly intact. Skin: No rashes or petechiae noted. Psych: Normal affect.  LAB RESULTS:  Lab Results  Component Value Date   NA 137 04/05/2023   K 4.3 04/05/2023   CL 103 04/05/2023   CO2 24 04/05/2023   GLUCOSE 107 (H) 04/05/2023   BUN 16 04/05/2023   CREATININE 1.03 (H) 04/05/2023   CALCIUM  9.5 04/05/2023   PROT 7.3 04/05/2023   ALBUMIN 4.5 04/05/2023   AST 26 04/05/2023   ALT 25 04/05/2023   ALKPHOS 68 04/05/2023   BILITOT 1.0 04/05/2023   GFRNONAA 56 (L) 04/05/2023   GFRAA >  60 10/04/2016    Lab Results  Component Value Date   WBC 5.0 01/04/2024   NEUTROABS 3.2 01/04/2024   HGB 15.4 (H) 01/04/2024   HCT 45.5 01/04/2024   MCV 103.4 (H) 01/04/2024   PLT 193 01/04/2024   Lab Results  Component Value Date   IRON  131 01/04/2024   TIBC 438 01/04/2024   IRONPCTSAT 30 01/04/2024   Lab Results  Component Value Date   FERRITIN 17 01/04/2024     STUDIES: No results found.  ASSESSMENT: Iron  deficiency anemia.  PLAN:    Iron  deficiency anemia: Resolved.  Patient's hemoglobin is actually mildly elevated today.  Iron  stores continue to be within normal limits.  Patient last received 200 mg of IV Venofer  on March 15, 2023.  No intervention is needed.  Patient does not require additional treatment.  Patient is transitioning between primary care physicians, therefore is requested to return to clinic in 6 months for laboratory work and further evaluation.  If her hemoglobin and iron  stores continue to be within normal limits at that time, she likely can be discharged from clinic.  I spent a total of 20 minutes reviewing chart data,  face-to-face evaluation with the patient, counseling and coordination of care as detailed above.   Patient expressed understanding and was in agreement with this plan. She also understands that She can call clinic at any time with any questions, concerns, or complaints.    Evalene JINNY Reusing, MD   01/04/2024 2:50 PM

## 2024-04-11 ENCOUNTER — Ambulatory Visit (INDEPENDENT_AMBULATORY_CARE_PROVIDER_SITE_OTHER)

## 2024-04-11 VITALS — BP 165/103 | HR 74 | Ht 64.0 in | Wt 187.4 lb

## 2024-04-11 DIAGNOSIS — G43909 Migraine, unspecified, not intractable, without status migrainosus: Secondary | ICD-10-CM | POA: Insufficient documentation

## 2024-04-11 DIAGNOSIS — Z96642 Presence of left artificial hip joint: Secondary | ICD-10-CM | POA: Diagnosis not present

## 2024-04-11 DIAGNOSIS — D509 Iron deficiency anemia, unspecified: Secondary | ICD-10-CM

## 2024-04-11 DIAGNOSIS — F419 Anxiety disorder, unspecified: Secondary | ICD-10-CM | POA: Insufficient documentation

## 2024-04-11 DIAGNOSIS — I10 Essential (primary) hypertension: Secondary | ICD-10-CM | POA: Insufficient documentation

## 2024-04-11 DIAGNOSIS — E785 Hyperlipidemia, unspecified: Secondary | ICD-10-CM | POA: Insufficient documentation

## 2024-04-11 DIAGNOSIS — Z13820 Encounter for screening for osteoporosis: Secondary | ICD-10-CM

## 2024-04-11 DIAGNOSIS — G43009 Migraine without aura, not intractable, without status migrainosus: Secondary | ICD-10-CM

## 2024-04-11 DIAGNOSIS — E782 Mixed hyperlipidemia: Secondary | ICD-10-CM

## 2024-04-11 MED ORDER — ONDANSETRON 4 MG PO TBDP: 4.0000 mg | ORAL_TABLET | Freq: Three times a day (TID) | ORAL | 1 refills | Status: AC | PRN

## 2024-04-11 NOTE — Progress Notes (Signed)
 New Patient Visit   Physician: Zackrey Dyar A Tyrina Hines, MD  Patient: Tiffany Mcclain   DOB: 25-Aug-1945   78 y.o. Female  MRN: [id number] Visit Date: 04/11/2024   Chief Complaint  Patient presents with   Establish Care   Subjective  Tiffany Mcclain is a 78 y.o. female who presents today as a new patient to establish care.   HPI  Discussed the use of AI scribe software for clinical note transcription with the patient, who gave verbal consent to proceed.  History of Present Illness   Tiffany Mcclain is a 78 year old female with iron  deficiency anemia who presents with dizziness and nausea. She is accompanied by her husband, Ozell.  Dizziness and nausea - Intermittent episodes of dizziness and nausea, primarily triggered by motion such as car rides - Symptoms do not occur when stationary - she relates this to her more recent episodes of severe iron  deficiency - Keeps Zofran  on hand and takes as needed for nausea and dizziness   Iron  deficiency anemia - History of iron  deficiency anemia requiring blood transfusions and iron  infusions - followed by oncology - Associates onset of dizziness and nausea with low iron  levels - Low iron  levels also associated with shortness of breath and decreased exercise tolerance - Monitors blood pressure at home, which tends to rise when iron  levels are low - Avoids red meat due to digestive issues which may be contributing factor  Shortness of breath and exercise tolerance - Shortness of breath occurs when iron  levels are low - Decreased exercise tolerance during periods of low iron  - No chest pain  Blood pressure elevation - Previously normal blood pressure, now elevated since onset of iron  deficiency anemia - Home blood pressure monitoring shows elevations correlating with low iron  levels  - BP in office 165/103  Migraine headaches - History of migraines, improved over the past year and a half - Keeps medication on hand for  potential episodes - rare episodes which respond to sumatriptan  Gastrointestinal symptoms - No constipation  Psychiatric history - Takes escitalopram  20 mg for anxiety and depression, ongoing for 15 to 20 years   - Reports mood as stable  Does not exercise.         ASSESSMENT & PLAN  Encounter Diagnoses  Name Primary?   Iron  deficiency anemia, unspecified iron  deficiency anemia type Yes   History of left hip replacement    Anxiety    Migraine without aura and without status migrainosus, not intractable    Mixed hyperlipidemia    Osteoporosis screening    Hypertension, unspecified type     Orders Placed This Encounter  Procedures   DG Bone Density   Iron , TIBC and Ferritin Panel   B12 and Folate Panel   CBC with Differential/Platelet   Comprehensive metabolic panel with GFR   Hemoglobin A1c   Lipid panel   Urinalysis, Routine w reflex microscopic    Assessment and Plan    Iron  deficiency anemia   She requires periodic iron  transfusions, which improve her symptoms. Ordered labs to check iron  levels and will monitor them biannually. Consider additional transfusion if symptoms worsen.  Hypertension   Recent onset possibly related to anemia, with blood pressure elevated at 165/100. Monitor blood pressure at home, log given. and report if systolic blood pressure remains above 150. Ordered labs to assess status.  Motion-induced nausea and dizziness   Intermittent symptoms managed with Zofran . Refilled Zofran  prescription. Monitor symptoms and use as needed.  Anxiety and depression   Well-managed with escitalopram  for 15-20 years. Continue escitalopram  as prescribed.  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 Ordered baseline bone density scan. Recommended vitamin D3 supplementation (2000 units daily).  General Health Maintenance   Discussed exercise importance  for health and mobility prevention. Encouraged regular exercise. Recommended vitamin D3 supplementation (2000 units daily).     Migraines  - Seem to be infrequent and well controlled.  Continue sumatriptan as needed  Hyperlipidemia She remains on lipitor.  No FH CAD.  Will obtain repeat lipid panel  F/u in 3 weeks to review labs.  F/u before appt time if BP remains elevated.       Objective  Pulse 74   Ht 5' 4 (1.626 m)   Wt 187 lb 6.4 oz (85 kg)   SpO2 92%   BMI 32.17 kg/m      Review of Systems  Constitutional:  Negative for chills, fever and weight loss.  Eyes:  Negative for blurred vision. h Respiratory:  Negative for cough and shortness of breath.   Cardiovascular:  Negative for chest pain and palpitations.  Skin:  Negative for rash.  Psychiatric/Behavioral:  Negative for depression. The patient is not nervous/anxious.      Physical Exam Physical Exam Vitals reviewed.  Constitutional:      Appearance: Normal appearance. Well-developed with normal weight.  HENT:     Head: Normocephalic and atraumatic.  Normal mucous membranes, no oral lesions Eyes:     Pupils: Pupils are equal, round, and reactive to light.  Neck:     Thyroid: No thyroid mass or thyromegaly.  Cardiovascular:     Rate and Rhythm: Normal rate and regular rhythm. Normal heart sounds. Normal peripheral pulses Pulmonary:     Normal breath sounds with normal effort Abdominal:   Abdomen is soft, without tenderness or noted hepatosplenomegaly Musculoskeletal:        General: No swelling or edema  Lymphadenopathy:     Cervical: No cervical adenopathy.  Skin:    General: Skin is warm and dry without noticeable rash. Neurological:     General: No focal deficit present.  Psychiatric:        Mood and Affect: Mood, behavior and cognition normal   Past Medical History:  Diagnosis Date   Anemia    Anxiety    Arthritis    Clubbed foot    left   Depression    Duodenal ulcer 10/2016   Family  history of adverse reaction to anesthesia    father combative/AMS after anethesia    Fracture of right humerus    hx of    GERD (gastroesophageal reflux disease)    Headache    hx of migraines    History of hiatal hernia    History of kidney stones    Pyelonephritis 10/03/2016   Shortness of breath dyspnea    due to anemia   UTI (urinary tract infection)    Past Surgical History:  Procedure Laterality Date   ABDOMINAL HYSTERECTOMY  1980   COLONOSCOPY WITH PROPOFOL  N/A 11/20/2016   Procedure: COLONOSCOPY WITH PROPOFOL ;  Surgeon: Jinny Carmine, MD;  Location[facility];  Service: Endoscopy;  Laterality: N/A;   CYSTOSCOPY W/ RETROGRADES Right 10/18/2016   Procedure: CYSTOSCOPY WITH RETROGRADE PYELOGRAM;  Surgeon: Chauncey Redell Agent, MD;  Location: [facility];  Service: Urology;  Laterality: Right;   CYSTOSCOPY W/  URETERAL STENT PLACEMENT Right 10/18/2016   Procedure: CYSTOSCOPY WITH STENT REPLACEMENT;  Surgeon: Chauncey Redell Agent, MD;  Location: [facility];  Service: Urology;  Laterality: Right;   CYSTOSCOPY WITH STENT PLACEMENT Right 10/03/2016   Procedure: CYSTOSCOPY WITH STENT PLACEMENT;  Surgeon: Belvie LITTIE Clara, MD;  Location: [facility];  Service: Urology;  Laterality: Right;   ESOPHAGOGASTRODUODENOSCOPY N/A 10/05/2016   Procedure: ESOPHAGOGASTRODUODENOSCOPY (EGD);  Surgeon: Rogelia Copping, MD;  Location: [facility];  Service: Endoscopy;  Laterality: N/A;   EYE SURGERY  2023   POLYPECTOMY  11/20/2016   Procedure: POLYPECTOMY;  Surgeon: Copping Rogelia, MD;  Location: [facility];  Service: Endoscopy;;   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   Procedure: RIGHT TOTAL HIP ARTHROPLASTY ANTERIOR APPROACH;  Surgeon: Dempsey Melodi GAILS, MD;  Location: [facility];  Service: Orthopedics;  Laterality: Right;   TOTAL HIP ARTHROPLASTY Left 11/28/2021   Procedure: TOTAL HIP ARTHROPLASTY ANTERIOR APPROACH;  Surgeon: Melodi Dempsey, MD;  Location: [facility];  Service: Orthopedics;   Laterality: Left;   URETEROSCOPY Right 10/18/2016   Procedure: URETEROSCOPY;  Surgeon: Chauncey Redell Agent, MD;  Location: [facility];  Service: Urology;  Laterality: Right;   Family Status  Relation Name Status   Mother  Deceased   Father  Deceased   Neg Hx  (Not Specified)  No partnership data on file   Family History  Problem Relation Age of Onset   Hypertension Mother    CAD Neg Hx    Social History   Socioeconomic History   Marital status: Married    Spouse name: Not on file   Number of children: Not on file   Years of education: Not on file   Highest education level: Master's degree (e.g., MA, MS, MEng, MEd, MSW, MBA)  Occupational History   Not on file  Tobacco Use   Smoking status: Former    Current packs/day: 0.00    Types: Cigarettes    Quit date: 1972    Years since quitting: 53.8   Smokeless tobacco: Never   Tobacco comments:    smoked as teenager - 1 yr  Vaping Use   Vaping status: Never Used  Substance and Sexual Activity   Alcohol use: Yes    Alcohol/week: 7.0 standard drinks of alcohol    Types: 7 Glasses of wine per week    Comment: wine daily   Drug use: No   Sexual activity: Yes    Birth control/protection: None  Other Topics Concern   Not on file  Social History Narrative   Not on file   Social Drivers of Health   Financial Resource Strain: Low Risk  (04/07/2024)   Overall Financial Resource Strain (CARDIA)    Difficulty of Paying Living Expenses: Not very hard  Food Insecurity: No Food Insecurity (04/07/2024)   Hunger Vital Sign    Worried About Running Out of Food in the Last Year: Never true    Ran Out of Food in the Last Year: Never true  Transportation Needs: No Transportation Needs (04/07/2024)   PRAPARE - Administrator, Civil Service (Medical): No    Lack of Transportation (Non-Medical): No  Physical Activity: Inactive (04/07/2024)   Exercise Vital Sign    Days of Exercise per Week: 0 days    Minutes of Exercise per  Session: Not on file  Stress: Stress Concern Present (04/07/2024)   [employer] of Occupational Health - Occupational Stress Questionnaire    Feeling of Stress: Rather much  Social Connections:  Socially Integrated (04/07/2024)   Social Connection and Isolation Panel    Frequency of Communication with Friends and Family: More than three times a week    Frequency of Social Gatherings with Friends and Family: Three times a week    Attends Religious Services: More than 4 times per year    Active Member of Clubs or Organizations: Yes    Attends Banker Meetings: More than 4 times per year    Marital Status: Married   Outpatient Medications Prior to Visit  Medication Sig   acetaminophen  (TYLENOL ) 650 MG CR tablet Take 1,300 mg by mouth in the morning and at bedtime.   atorvastatin  (LIPITOR) 10 MG tablet Take 10 mg by mouth in the morning.   escitalopram  (LEXAPRO ) 20 MG tablet Take 10 mg by mouth every morning.   fluticasone (FLONASE) 50 MCG/ACT nasal spray Place 2 sprays into both nostrils daily as needed (sinus issues.).   Multiple Vitamins-Minerals (ICAPS AREDS 2 PO) Take by mouth.   SUMAtriptan (IMITREX) 100 MG tablet Take 100 mg by mouth every 2 (two) hours as needed for migraine. May repeat in 2 hours if headache persists or recurs.   [DISCONTINUED] diphenhydrAMINE  (BENADRYL ) 25 MG tablet Take 25 mg by mouth in the morning and at bedtime.   [DISCONTINUED] ondansetron  (ZOFRAN -ODT) 4 MG disintegrating tablet Take 4 mg by mouth every 8 (eight) hours as needed. (Patient not taking: Reported on 04/11/2024)   No facility-administered medications prior to visit.   Allergies  Allergen Reactions   Codeine Nausea Only    clammy feeling   Other Diarrhea    Red Meat    Immunization History  Administered Date(s) Administered   Fluad Trivalent(High Dose 65+) 02/15/2023   INFLUENZA, HIGH DOSE SEASONAL PF 04/23/2018   Pneumococcal-Unspecified 04/23/2018    Health Maintenance   Topic Date Due   Medicare Annual Wellness (AWV)  Never done   Hepatitis C Screening  Never done   DTaP/Tdap/Td (1 - Tdap) Never done   Pneumococcal Vaccine: 50+ Years (1 of 1 - PCV) 06/28/1996   Zoster Vaccines- Shingrix (1 of 2) Never done   DEXA SCAN  Never done   Colonoscopy  11/20/2021   Influenza Vaccine  01/04/2024   COVID-19 Vaccine (1 - 2025-26 season) Never done   Meningococcal B Vaccine  Aged Out    Patient Care Team: Everlene Parris LABOR, MD as PCP - General (Family Medicine) Jacobo Evalene PARAS, MD as Consulting Physician (Oncology) Everlene Parris LABOR, MD as Referring Physician (Family Medicine)  Depression Screen     No data to display           Parris LABOR Everlene, MD  [facility] [phone] (phone) [phone] (fax)  [facility]

## 2024-04-22 ENCOUNTER — Telehealth: Payer: Self-pay | Admitting: Pharmacy Technician

## 2024-04-22 ENCOUNTER — Other Ambulatory Visit (HOSPITAL_COMMUNITY): Payer: Self-pay

## 2024-04-22 ENCOUNTER — Encounter: Payer: Self-pay | Admitting: Internal Medicine

## 2024-04-22 NOTE — Telephone Encounter (Signed)
 Pharmacy Patient Advocate Encounter   Received notification from Onbase/CoverMyMeds that prior authorization for Ondansetron  4mg  ODT is required/requested.   Insurance verification completed.   The patient is insured through [facility] ADVANTAGE/RX ADVANCE.   PA form saved to pt's chart. Unfortunately her diagnosis isn't one the FDA has approved this medication for. Scopolamine patches are covered by her ins, but they are $66.40 for a months supply.

## 2024-04-28 ENCOUNTER — Other Ambulatory Visit

## 2024-04-28 DIAGNOSIS — D509 Iron deficiency anemia, unspecified: Secondary | ICD-10-CM

## 2024-04-28 DIAGNOSIS — F419 Anxiety disorder, unspecified: Secondary | ICD-10-CM

## 2024-04-28 DIAGNOSIS — G43009 Migraine without aura, not intractable, without status migrainosus: Secondary | ICD-10-CM

## 2024-04-28 DIAGNOSIS — E782 Mixed hyperlipidemia: Secondary | ICD-10-CM

## 2024-04-28 DIAGNOSIS — Z13820 Encounter for screening for osteoporosis: Secondary | ICD-10-CM

## 2024-04-28 DIAGNOSIS — Z96642 Presence of left artificial hip joint: Secondary | ICD-10-CM

## 2024-04-29 LAB — CBC WITH DIFFERENTIAL/PLATELET
Absolute Lymphocytes: 1020 {cells}/uL (ref 850–3900)
Absolute Monocytes: 383 {cells}/uL (ref 200–950)
Basophils Absolute: 31 {cells}/uL (ref 0–200)
Basophils Relative: 0.6 %
Eosinophils Absolute: 291 {cells}/uL (ref 15–500)
Eosinophils Relative: 5.7 %
HCT: 45.5 % (ref 35.9–46.0)
Hemoglobin: 14.7 g/dL (ref 11.7–15.5)
MCH: 32.7 pg (ref 27.0–33.0)
MCHC: 32.3 g/dL (ref 31.6–35.4)
MCV: 101.3 fL (ref 81.4–101.7)
MPV: 11.5 fL (ref 7.5–12.5)
Monocytes Relative: 7.5 %
Neutro Abs: 3376 {cells}/uL (ref 1500–7800)
Neutrophils Relative %: 66.2 %
Platelets: 226 Thousand/uL (ref 140–400)
RBC: 4.49 Million/uL (ref 3.80–5.10)
RDW: 13 % (ref 11.0–15.0)
Total Lymphocyte: 20 %
WBC: 5.1 Thousand/uL (ref 3.8–10.8)

## 2024-04-29 LAB — COMPREHENSIVE METABOLIC PANEL WITH GFR
AG Ratio: 1.7 (calc) (ref 1.0–2.5)
ALT: 13 U/L (ref 6–29)
AST: 16 U/L (ref 10–35)
Albumin: 4.2 g/dL (ref 3.6–5.1)
Alkaline phosphatase (APISO): 73 U/L (ref 37–153)
BUN: 14 mg/dL (ref 7–25)
CO2: 26 mmol/L (ref 20–32)
Calcium: 9.5 mg/dL (ref 8.6–10.4)
Chloride: 103 mmol/L (ref 98–110)
Creat: 0.93 mg/dL (ref 0.60–1.00)
Globulin: 2.5 g/dL (ref 1.9–3.7)
Glucose, Bld: 104 mg/dL — ABNORMAL HIGH (ref 65–99)
Potassium: 3.6 mmol/L (ref 3.5–5.3)
Sodium: 141 mmol/L (ref 135–146)
Total Bilirubin: 0.6 mg/dL (ref 0.2–1.2)
Total Protein: 6.7 g/dL (ref 6.1–8.1)
eGFR: 63 mL/min/1.73m2 (ref >=60)

## 2024-04-29 LAB — B12 AND FOLATE PANEL
Folate: 16.6 ng/mL
Vitamin B-12: 626 pg/mL (ref 200–1100)

## 2024-04-29 LAB — LIPID PANEL
Cholesterol: 158 mg/dL (ref <200)
HDL: 86 mg/dL (ref >=50)
LDL Cholesterol (Calc): 50 mg/dL
Non-HDL Cholesterol (Calc): 72 mg/dL (ref <130)
Total CHOL/HDL Ratio: 1.8 (calc) (ref <5.0)
Triglycerides: 139 mg/dL (ref <150)

## 2024-04-29 LAB — IRON,TIBC AND FERRITIN PANEL
%SAT: 23 % (ref 16–45)
Ferritin: 15 ng/mL — ABNORMAL LOW (ref 16–288)
Iron: 102 ug/dL — AB (ref 45–288)
TIBC: 435 ug/dL (ref 250–450)

## 2024-04-29 LAB — HEMOGLOBIN A1C
Hgb A1c MFr Bld: 5.2 % (ref <5.7)
Mean Plasma Glucose: 103 mg/dL
eAG (mmol/L): 5.7 mmol/L

## 2024-05-14 ENCOUNTER — Ambulatory Visit (INDEPENDENT_AMBULATORY_CARE_PROVIDER_SITE_OTHER)

## 2024-05-14 VITALS — BP 142/96 | HR 78 | Ht 64.0 in | Wt 186.0 lb

## 2024-05-14 DIAGNOSIS — I10 Essential (primary) hypertension: Secondary | ICD-10-CM

## 2024-05-14 DIAGNOSIS — D509 Iron deficiency anemia, unspecified: Secondary | ICD-10-CM | POA: Diagnosis not present

## 2024-05-14 DIAGNOSIS — T753XXA Motion sickness, initial encounter: Secondary | ICD-10-CM | POA: Insufficient documentation

## 2024-05-14 DIAGNOSIS — T753XXD Motion sickness, subsequent encounter: Secondary | ICD-10-CM

## 2024-05-14 MED ORDER — LOSARTAN POTASSIUM 25 MG PO TABS: 25.0000 mg | ORAL_TABLET | Freq: Every day | ORAL | 1 refills | Status: AC

## 2024-05-14 MED ORDER — MECLIZINE HCL 25 MG PO TABS: 25.0000 mg | ORAL_TABLET | Freq: Three times a day (TID) | ORAL | 1 refills | Status: AC | PRN

## 2024-05-14 NOTE — Progress Notes (Signed)
 Progress Note  Physician: Mirenda Baltazar A Stacey Sago, MD   HPI: Tiffany Mcclain is a 78 y.o. female presenting on 05/14/2024 for Follow-up (Patient is concerned about knot on the left side of her neck. ) .  Discussed the use of AI scribe software for clinical note transcription with the patient, who gave verbal consent to proceed.  History of Present Illness         I personally reviewed and interpreted the patient labs today.    Dizziness and nausea - Occasional dizziness and nausea, particularly when traveling by car. - Symptoms attributed to change after COVID vaccination. - Uses Zofran  for nausea, which provides partial relief. - Continues to experience symptoms after long car rides.  Migraine and motion sickness - History of migraines, frequency decreased since COVID vaccination. - Currently experiences car sickness instead of migraines.  Sore throat and lymphadenopathy - Recent severe sore throat resolved after one day. - Swollen gland appeared after sore throat but has not persisted.     Hypertension  - BP log showing 130-140 systolic with frequently elevated diastolic pressures    Medical history:  Relevant past medical, surgical, family and social history reviewed and updated as indicated. Interim medical history since our last visit reviewed.  Allergies and medications reviewed and updated.   ROS: Negative unless specifically indicated above in HPI.    Current Outpatient Medications:    acetaminophen  (TYLENOL ) 650 MG CR tablet, Take 1,300 mg by mouth in the morning and at bedtime., Disp: , Rfl:    atorvastatin  (LIPITOR) 10 MG tablet, Take 10 mg by mouth in the morning., Disp: , Rfl:    escitalopram  (LEXAPRO ) 20 MG tablet, Take 10 mg by mouth every morning., Disp: , Rfl:    fluticasone (FLONASE) 50 MCG/ACT nasal spray, Place 2 sprays into both nostrils daily as needed (sinus issues.)., Disp: , Rfl:    Multiple Vitamins-Minerals (ICAPS AREDS 2 PO),  Take by mouth., Disp: , Rfl:    ondansetron  (ZOFRAN -ODT) 4 MG disintegrating tablet, Take 1 tablet (4 mg total) by mouth every 8 (eight) hours as needed., Disp: 20 tablet, Rfl: 1   SUMAtriptan (IMITREX) 100 MG tablet, Take 100 mg by mouth every 2 (two) hours as needed for migraine. May repeat in 2 hours if headache persists or recurs., Disp: , Rfl:        Objective:     BP (!) 142/96   Pulse 78   Ht 5' 4 (1.626 m)   Wt 186 lb (84.4 kg)   SpO2 95%   BMI 31.93 kg/m   Wt Readings from Last 3 Encounters:  05/14/24 186 lb (84.4 kg)  04/11/24 187 lb 6.4 oz (85 kg)  01/04/24 185 lb 1.6 oz (84 kg)    Physical Exam  Physical Exam Vitals reviewed.  Constitutional:      Appearance: Normal appearance. Well-developed with normal weight.  Cardiovascular:     Rate and Rhythm: Normal rate and regular rhythm. Normal heart sounds. Normal peripheral pulses Pulmonary:     Normal breath sounds with normal effort Skin:    General: Skin is warm and dry without noticeable rash. Neurological:     General: No focal deficit present.  Psychiatric:        Mood and Affect: Mood, behavior and cognition normal      Assessment & Plan:   Encounter Diagnoses  Name Primary?   Iron  deficiency anemia, unspecified iron  deficiency anemia type Yes  Hypertension, unspecified type    Motion sickness, subsequent encounter     No orders of the defined types were placed in this encounter.    Assessment and Plan         Iron  deficiency with history of iron  infusion Iron  deficiency with low ferritin. Hemoglobin improved. Dizziness and fatigue likely due to low iron . Previous infusions effective. Oral iron  not tolerated. - Recheck iron  levels in March. - Consider iron  infusion if symptoms worsen or iron  levels drop.    Hypertension Variable home blood pressure readings with occasional elevations and nausea. No current antihypertensive medication. - Prescribed losartan 25 mg daily. - Monitor blood  pressure at home every 2-3 days. - Follow up if blood pressure remains elevated or symptoms occur.  Motion sickness with vertigo and nausea Motion sickness with vertigo and nausea after long car rides. Symptoms improve with rest and medication. Zofran  effective for nausea. Meclizine  suggested for vertigo. - Prescribed meclizine  for vertigo, to be taken an hour before travel. - Continue Zofran  for nausea as needed. - Trial meclizine  before upcoming travel.  Hyperlipidemia - Labe reviewed.  Continue atorvastatin  at current dose Lipid Panel     Component Value Date/Time   CHOL 158 04/28/2024 1010   TRIG 139 04/28/2024 1010   HDL 86 04/28/2024 1010   CHOLHDL 1.8 04/28/2024 1010   LDLCALC 50 04/28/2024 1010

## 2024-05-16 ENCOUNTER — Other Ambulatory Visit: Payer: Self-pay

## 2024-05-16 ENCOUNTER — Telehealth: Payer: Self-pay

## 2024-05-16 MED ORDER — ATORVASTATIN CALCIUM 10 MG PO TABS: 10.0000 mg | ORAL_TABLET | Freq: Every morning | ORAL | 3 refills | Status: AC

## 2024-05-16 NOTE — Telephone Encounter (Signed)
 error

## 2024-05-16 NOTE — Telephone Encounter (Signed)
 Prescription Request  05/16/2024  LOV: 05/16/2024  What is the name of the medication or equipment? atorvastatin  (LIPITOR) 10 MG tablet   Have you contacted your pharmacy to request a refill? No   Which pharmacy would you like this sent to?  [facility] [place],  - [address] [place] [address] Phone: [phone] Fax: [phone]    Patient notified that their request is being sent to the clinical staff for review and that they should receive a response within 2 business days.   Please advise at Mobile [phone] (mobile)

## 2024-06-30 ENCOUNTER — Encounter: Payer: Self-pay | Admitting: Internal Medicine

## 2024-07-07 ENCOUNTER — Inpatient Hospital Stay

## 2024-07-08 ENCOUNTER — Inpatient Hospital Stay

## 2024-07-08 ENCOUNTER — Ambulatory Visit: Admitting: Oncology

## 2024-07-08 ENCOUNTER — Telehealth: Payer: Self-pay | Admitting: Oncology

## 2024-07-15 ENCOUNTER — Other Ambulatory Visit

## 2024-07-23 ENCOUNTER — Ambulatory Visit

## 2024-08-01 ENCOUNTER — Inpatient Hospital Stay

## 2024-08-04 ENCOUNTER — Inpatient Hospital Stay: Admitting: Oncology

## 2024-08-04 ENCOUNTER — Inpatient Hospital Stay

## 2024-08-07 ENCOUNTER — Ambulatory Visit: Admitting: Internal Medicine

## 2024-08-20 ENCOUNTER — Ambulatory Visit

## 2024-10-20 ENCOUNTER — Encounter: Admitting: Internal Medicine
